# Patient Record
Sex: Male | Born: 1980 | Race: White | Hispanic: No | Marital: Single | Smoking: Current every day smoker
Health system: Southern US, Community
[De-identification: ages and names within clinical notes are randomized; demographics above are authoritative.]

## PROBLEM LIST (undated history)

## (undated) DIAGNOSIS — F329 Major depressive disorder, single episode, unspecified: Secondary | ICD-10-CM

## (undated) DIAGNOSIS — I313 Pericardial effusion (noninflammatory): Secondary | ICD-10-CM

## (undated) DIAGNOSIS — I10 Essential (primary) hypertension: Secondary | ICD-10-CM

## (undated) HISTORY — PX: WISDOM TOOTH EXTRACTION: SHX21

---
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
## 2018-10-11 ENCOUNTER — Inpatient Hospital Stay (HOSPITAL_COMMUNITY)
Admission: EM | Admit: 2018-10-11 | Discharge: 2018-10-15 | DRG: 605 | Disposition: A | Discharge status: Other Acute Inpatient Hospital | Payer: Self-pay | Source: Other Acute Inpatient Hospital | Attending: General Surgery | Admitting: General Surgery

## 2018-10-11 ENCOUNTER — Encounter: Payer: Self-pay | Admitting: Emergency Medicine

## 2018-10-11 ENCOUNTER — Emergency Department
Admission: EM | Admit: 2018-10-11 | Discharge: 2018-10-11 | Disposition: A | Discharge status: Other Acute Inpatient Hospital | Payer: Self-pay | Attending: Emergency Medicine | Admitting: Emergency Medicine

## 2018-10-11 ENCOUNTER — Emergency Department: Payer: Self-pay

## 2018-10-11 ENCOUNTER — Encounter (HOSPITAL_COMMUNITY): Payer: Self-pay | Admitting: Emergency Medicine

## 2018-10-11 ENCOUNTER — Other Ambulatory Visit: Payer: Self-pay

## 2018-10-11 DIAGNOSIS — W01118A Fall on same level from slipping, tripping and stumbling with subsequent striking against other sharp object, initial encounter: Secondary | ICD-10-CM | POA: Insufficient documentation

## 2018-10-11 DIAGNOSIS — S31119A Laceration without foreign body of abdominal wall, unspecified quadrant without penetration into peritoneal cavity, initial encounter: Secondary | ICD-10-CM

## 2018-10-11 DIAGNOSIS — Y92007 Garden or yard of unspecified non-institutional (private) residence as the place of occurrence of the external cause: Secondary | ICD-10-CM | POA: Insufficient documentation

## 2018-10-11 DIAGNOSIS — Z8249 Family history of ischemic heart disease and other diseases of the circulatory system: Secondary | ICD-10-CM

## 2018-10-11 DIAGNOSIS — Z7289 Other problems related to lifestyle: Secondary | ICD-10-CM

## 2018-10-11 DIAGNOSIS — Z885 Allergy status to narcotic agent status: Secondary | ICD-10-CM

## 2018-10-11 DIAGNOSIS — S36114A Minor laceration of liver, initial encounter: Secondary | ICD-10-CM | POA: Diagnosis present

## 2018-10-11 DIAGNOSIS — IMO0002 Reserved for concepts with insufficient information to code with codable children: Secondary | ICD-10-CM

## 2018-10-11 DIAGNOSIS — I10 Essential (primary) hypertension: Secondary | ICD-10-CM | POA: Insufficient documentation

## 2018-10-11 DIAGNOSIS — T148XXA Other injury of unspecified body region, initial encounter: Secondary | ICD-10-CM

## 2018-10-11 DIAGNOSIS — D62 Acute posthemorrhagic anemia: Secondary | ICD-10-CM | POA: Diagnosis present

## 2018-10-11 DIAGNOSIS — F17200 Nicotine dependence, unspecified, uncomplicated: Secondary | ICD-10-CM | POA: Insufficient documentation

## 2018-10-11 DIAGNOSIS — Z1159 Encounter for screening for other viral diseases: Secondary | ICD-10-CM

## 2018-10-11 DIAGNOSIS — Y93K1 Activity, walking an animal: Secondary | ICD-10-CM

## 2018-10-11 DIAGNOSIS — Y9389 Activity, other specified: Secondary | ICD-10-CM | POA: Insufficient documentation

## 2018-10-11 DIAGNOSIS — Y929 Unspecified place or not applicable: Secondary | ICD-10-CM

## 2018-10-11 DIAGNOSIS — R1010 Upper abdominal pain, unspecified: Secondary | ICD-10-CM | POA: Insufficient documentation

## 2018-10-11 DIAGNOSIS — R9431 Abnormal electrocardiogram [ECG] [EKG]: Secondary | ICD-10-CM

## 2018-10-11 DIAGNOSIS — I309 Acute pericarditis, unspecified: Secondary | ICD-10-CM | POA: Diagnosis present

## 2018-10-11 DIAGNOSIS — F17210 Nicotine dependence, cigarettes, uncomplicated: Secondary | ICD-10-CM | POA: Diagnosis present

## 2018-10-11 DIAGNOSIS — S31139A Puncture wound of abdominal wall without foreign body, unspecified quadrant without penetration into peritoneal cavity, initial encounter: Secondary | ICD-10-CM | POA: Insufficient documentation

## 2018-10-11 DIAGNOSIS — Y999 Unspecified external cause status: Secondary | ICD-10-CM | POA: Insufficient documentation

## 2018-10-11 DIAGNOSIS — S31112A Laceration without foreign body of abdominal wall, epigastric region without penetration into peritoneal cavity, initial encounter: Principal | ICD-10-CM | POA: Diagnosis present

## 2018-10-11 DIAGNOSIS — F1510 Other stimulant abuse, uncomplicated: Secondary | ICD-10-CM | POA: Diagnosis present

## 2018-10-11 DIAGNOSIS — Z8673 Personal history of transient ischemic attack (TIA), and cerebral infarction without residual deficits: Secondary | ICD-10-CM

## 2018-10-11 DIAGNOSIS — Z046 Encounter for general psychiatric examination, requested by authority: Secondary | ICD-10-CM

## 2018-10-11 DIAGNOSIS — R0789 Other chest pain: Secondary | ICD-10-CM

## 2018-10-11 DIAGNOSIS — W010XXA Fall on same level from slipping, tripping and stumbling without subsequent striking against object, initial encounter: Secondary | ICD-10-CM | POA: Diagnosis present

## 2018-10-11 HISTORY — DX: Essential (primary) hypertension: I10

## 2018-10-11 LAB — COMPREHENSIVE METABOLIC PANEL
ALT: 37 U/L (ref 0–44)
AST: 83 U/L — ABNORMAL HIGH (ref 15–41)
Albumin: 4.5 g/dL (ref 3.5–5.0)
Alkaline Phosphatase: 64 U/L (ref 38–126)
Anion gap: 13 (ref 5–15)
BUN: 25 mg/dL — ABNORMAL HIGH (ref 6–20)
CO2: 23 mmol/L (ref 22–32)
Calcium: 8.7 mg/dL — ABNORMAL LOW (ref 8.9–10.3)
Chloride: 100 mmol/L (ref 98–111)
Creatinine, Ser: 0.92 mg/dL (ref 0.61–1.24)
GFR calc Af Amer: >60 mL/min (ref >60)
GFR calc non Af Amer: >60 mL/min (ref >60)
Glucose, Bld: 124 mg/dL — ABNORMAL HIGH (ref 70–99)
Potassium: 3.1 mmol/L — ABNORMAL LOW (ref 3.5–5.1)
Sodium: 136 mmol/L (ref 135–145)
Total Bilirubin: 2 mg/dL — ABNORMAL HIGH (ref 0.3–1.2)
Total Protein: 7.8 g/dL (ref 6.5–8.1)

## 2018-10-11 LAB — URINE DRUG SCREEN, QUALITATIVE (ARMC ONLY)
Amphetamines, Ur Screen: POSITIVE — AB
Barbiturates, Ur Screen: NOT DETECTED
Benzodiazepine, Ur Scrn: NOT DETECTED
Cannabinoid 50 Ng, Ur ~~LOC~~: POSITIVE — AB
Cocaine Metabolite,Ur ~~LOC~~: NOT DETECTED
MDMA (Ecstasy)Ur Screen: NOT DETECTED
Methadone Scn, Ur: NOT DETECTED
Opiate, Ur Screen: NOT DETECTED
Phencyclidine (PCP) Ur S: NOT DETECTED
Tricyclic, Ur Screen: NOT DETECTED

## 2018-10-11 LAB — CBC
HCT: 36.3 % — ABNORMAL LOW (ref 39.0–52.0)
HCT: 34 % — ABNORMAL LOW (ref 39.0–52.0)
Hemoglobin: 12.4 g/dL — ABNORMAL LOW (ref 13.0–17.0)
Hemoglobin: 11.7 g/dL — ABNORMAL LOW (ref 13.0–17.0)
MCH: 30.5 pg (ref 26.0–34.0)
MCH: 31.2 pg (ref 26.0–34.0)
MCHC: 34.2 g/dL (ref 30.0–36.0)
MCHC: 34.4 g/dL (ref 30.0–36.0)
MCV: 89.2 fL (ref 80.0–100.0)
MCV: 90.7 fL (ref 80.0–100.0)
Platelets: 215 10*3/uL (ref 150–400)
Platelets: 189 10*3/uL (ref 150–400)
RBC: 4.07 MIL/uL — ABNORMAL LOW (ref 4.22–5.81)
RBC: 3.75 MIL/uL — ABNORMAL LOW (ref 4.22–5.81)
RDW: 12.5 % (ref 11.5–15.5)
RDW: 12.8 % (ref 11.5–15.5)
WBC: 17.8 10*3/uL — ABNORMAL HIGH (ref 4.0–10.5)
WBC: 17.1 10*3/uL — ABNORMAL HIGH (ref 4.0–10.5)
nRBC: 0 % (ref 0.0–0.2)
nRBC: 0 % (ref 0.0–0.2)

## 2018-10-11 LAB — CK: Total CK: 1787 U/L — ABNORMAL HIGH (ref 49–397)

## 2018-10-11 LAB — HIV ANTIBODY (ROUTINE TESTING W REFLEX): HIV Screen 4th Generation wRfx: NONREACTIVE

## 2018-10-11 LAB — TYPE AND SCREEN
ABO/RH(D): A NEG
Antibody Screen: NEGATIVE

## 2018-10-11 LAB — LIPASE, BLOOD: Lipase: 23 U/L (ref 11–51)

## 2018-10-11 LAB — ACETAMINOPHEN LEVEL: Acetaminophen (Tylenol), Serum: <10 ug/mL — ABNORMAL LOW (ref 10–30)

## 2018-10-11 LAB — SALICYLATE LEVEL: Salicylate Lvl: <7 mg/dL (ref 2.8–30.0)

## 2018-10-11 LAB — SARS CORONAVIRUS 2 BY RT PCR (HOSPITAL ORDER, PERFORMED IN ~~LOC~~ HOSPITAL LAB): SARS Coronavirus 2: NEGATIVE

## 2018-10-11 MED ORDER — CEFAZOLIN SODIUM-DEXTROSE 1-4 GM/50ML-% IV SOLN
1.0000 g | Freq: Once | INTRAVENOUS | Status: AC
  Administered 2018-10-11: 1 g via INTRAVENOUS
  Filled 2018-10-11: qty 50

## 2018-10-11 MED ORDER — HYDROMORPHONE HCL 1 MG/ML IJ SOLN: 0.5000 mg | INTRAMUSCULAR | Status: DC

## 2018-10-11 MED ORDER — LORAZEPAM 2 MG/ML IJ SOLN: 1.0000 mg | INTRAMUSCULAR | Status: DC | PRN

## 2018-10-11 MED ORDER — LORAZEPAM 2 MG/ML IJ SOLN
1.0000 mg | Freq: Once | INTRAMUSCULAR | Status: DC
  Filled 2018-10-11: qty 1

## 2018-10-11 MED ORDER — DEXTROSE-NACL 5-0.9 % IV SOLN
INTRAVENOUS | Status: DC
  Administered 2018-10-11: 06:00:00 via INTRAVENOUS

## 2018-10-11 MED ORDER — LORAZEPAM 2 MG/ML IJ SOLN
1.0000 mg | Freq: Once | INTRAMUSCULAR | Status: AC
  Administered 2018-10-11: 05:00:00 1 mg via INTRAVENOUS
  Filled 2018-10-11: qty 1

## 2018-10-11 MED ORDER — ENOXAPARIN SODIUM 40 MG/0.4ML ~~LOC~~ SOLN
40.0000 mg | SUBCUTANEOUS | Status: DC
  Administered 2018-10-11: 40 mg via SUBCUTANEOUS
  Filled 2018-10-11: qty 0.4

## 2018-10-11 MED ORDER — ONDANSETRON 4 MG PO TBDP: 4.0000 mg | ORAL_TABLET | Freq: Four times a day (QID) | ORAL | Status: DC | PRN

## 2018-10-11 MED ORDER — LORAZEPAM 2 MG/ML IJ SOLN: 1.0000 mg | Freq: Once | INTRAMUSCULAR | Status: DC

## 2018-10-11 MED ORDER — LORAZEPAM 2 MG/ML IJ SOLN
1.0000 mg | Freq: Once | INTRAMUSCULAR | Status: AC
  Administered 2018-10-11: 1 mg via INTRAVENOUS

## 2018-10-11 MED ORDER — OXYCODONE HCL 5 MG PO TABS
5.0000 mg | ORAL_TABLET | ORAL | Status: DC | PRN
  Administered 2018-10-11 – 2018-10-15 (×9): 10 mg via ORAL
  Filled 2018-10-11 (×10): qty 2

## 2018-10-11 MED ORDER — POTASSIUM CHLORIDE CRYS ER 20 MEQ PO TBCR
40.0000 meq | EXTENDED_RELEASE_TABLET | Freq: Two times a day (BID) | ORAL | Status: AC
  Administered 2018-10-11 (×2): 40 meq via ORAL
  Filled 2018-10-11 (×2): qty 2

## 2018-10-11 MED ORDER — ONDANSETRON HCL 4 MG/2ML IJ SOLN: 4.0000 mg | Freq: Four times a day (QID) | INTRAMUSCULAR | Status: DC | PRN

## 2018-10-11 MED ORDER — IOHEXOL 300 MG/ML  SOLN
100.0000 mL | Freq: Once | INTRAMUSCULAR | Status: AC | PRN
  Administered 2018-10-11: 100 mL via INTRAVENOUS

## 2018-10-11 MED ORDER — HYDROMORPHONE HCL 1 MG/ML IJ SOLN
1.0000 mg | INTRAMUSCULAR | Status: AC
  Administered 2018-10-11: 04:00:00 1 mg via INTRAVENOUS
  Filled 2018-10-11: qty 1

## 2018-10-11 MED ORDER — HYDROMORPHONE HCL 1 MG/ML IJ SOLN: 1.0000 mg | INTRAMUSCULAR | Status: DC | PRN
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
## 2018-10-11 NOTE — ED Notes (Signed)
IVC prior to arrival/ Medical @ this time

## 2018-10-11 NOTE — ED Notes (Signed)
Staffing notified RN that sitter is arriving at 7 am .

## 2018-10-11 NOTE — ED Provider Notes (Signed)
Became very anxious when transport arrived, he was up walking about doing meditative-like behavior.  He got more on edge, but with discussion he is able to be calm.  He has not done anything violent towards staff.  He was excepting of receiving 2 mg of Ativan to help with anxiolysis and accommodating and getting himself to the stretcher, seating himself.  He will be escorted by Event organiser to [place].   Delman Kitten, MD 10/11/18 [phone]

## 2018-10-11 NOTE — ED Notes (Signed)
Pt states he doesn't feel comfortable signing for transfer to [facility]
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
## 2018-10-11 NOTE — Consult Note (Signed)
Patient is too drowsy to hold a meaningful interview per nursing. Will attempt to interview again tomorrow.   Buford Dresser, DO 10/11/18 2:16 PM
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
## 2018-10-11 NOTE — ED Notes (Signed)
Standing at foot of bed, doesn't want to lay or sit down
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
## 2018-10-11 NOTE — Progress Notes (Signed)
Patient ID: Amadeus Oyama., male   DOB: Oct 21, 1980, 38 y.o.   MRN: [id number]    Subjective: Does not offer complaint  Objective: Vital signs in last 24 hours: Temp:  [98 F (36.7 C)-98.7 F (37.1 C)] 98.7 F (37.1 C) (05/19 0842) Pulse Rate:  [89-112] 90 (05/19 0842) Resp:  [12-25] 20 (05/19 0842) BP: (112-149)/(68-89) 112/78 (05/19 0842) SpO2:  [95 %-100 %] 100 % (05/19 0842) Weight:  [72.6 kg] 72.6 kg (05/19 0516)    Intake/Output from previous day: No intake/output data recorded. Intake/Output this shift: Total I/O In: 161 [I.V.:161] Out: -   General appearance: no distress Resp: clear to auscultation bilaterally Cardio: regular rate and rhythm GI: soft, tender at SW, no generalized tenderness, no epritonitis  Lab Results: CBC  Recent Labs    10/11/18 0239  WBC 17.8*  HGB 12.4*  HCT 36.3*  PLT 215   BMET Recent Labs    10/11/18 0239  NA 136  K 3.1*  CL 100  CO2 23  GLUCOSE 124*  BUN 25*  CREATININE 0.92  CALCIUM 8.7*   Assessment/Plan: SI SW abdomen Grade 1 liver laceration - mobilize, follow up CBC 1200 and in AM ABL anemia - mild Psychiatry consult FEN - soft diet, add PO pain meds, KVO IVF VTE  - Lovenox Dispo - above   LOS: 0 days    Georganna Skeans, MD, MPH, FACS Trauma & General Surgery: [phone]  10/11/2018
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
## 2018-10-11 NOTE — ED Notes (Addendum)
Patient wearing purple paper scrubs , sleeping at this time , respirations unlabored, IV site intact , sitter order requested . Personal belongings inventoried ,bagged and stored at locker#1 at [facility].
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
## 2018-10-12 ENCOUNTER — Inpatient Hospital Stay (HOSPITAL_COMMUNITY): Payer: Self-pay

## 2018-10-12 ENCOUNTER — Encounter (HOSPITAL_COMMUNITY): Payer: Self-pay | Admitting: Student

## 2018-10-12 ENCOUNTER — Encounter (HOSPITAL_COMMUNITY)
Admission: EM | Disposition: A | Discharge status: Other Acute Inpatient Hospital | Payer: Self-pay | Source: Other Acute Inpatient Hospital

## 2018-10-12 DIAGNOSIS — Z7289 Other problems related to lifestyle: Secondary | ICD-10-CM

## 2018-10-12 DIAGNOSIS — R0789 Other chest pain: Secondary | ICD-10-CM

## 2018-10-12 DIAGNOSIS — R079 Chest pain, unspecified: Secondary | ICD-10-CM

## 2018-10-12 DIAGNOSIS — R9431 Abnormal electrocardiogram [ECG] [EKG]: Secondary | ICD-10-CM

## 2018-10-12 DIAGNOSIS — IMO0002 Reserved for concepts with insufficient information to code with codable children: Secondary | ICD-10-CM

## 2018-10-12 HISTORY — PX: LEFT HEART CATH AND CORONARY ANGIOGRAPHY: CATH118249

## 2018-10-12 LAB — LIPID PANEL
Cholesterol: 145 mg/dL (ref 0–200)
HDL: 47 mg/dL (ref >40)
LDL Cholesterol: 89 mg/dL (ref 0–99)
Total CHOL/HDL Ratio: 3.1 RATIO
Triglycerides: 47 mg/dL (ref <150)
VLDL: 9 mg/dL (ref 0–40)

## 2018-10-12 LAB — CBC
HCT: 34.9 % — ABNORMAL LOW (ref 39.0–52.0)
Hemoglobin: 11.6 g/dL — ABNORMAL LOW (ref 13.0–17.0)
MCH: 30.5 pg (ref 26.0–34.0)
MCHC: 33.2 g/dL (ref 30.0–36.0)
MCV: 91.8 fL (ref 80.0–100.0)
Platelets: 176 10*3/uL (ref 150–400)
RBC: 3.8 MIL/uL — ABNORMAL LOW (ref 4.22–5.81)
RDW: 13 % (ref 11.5–15.5)
WBC: 13.7 10*3/uL — ABNORMAL HIGH (ref 4.0–10.5)
nRBC: 0 % (ref 0.0–0.2)

## 2018-10-12 LAB — BASIC METABOLIC PANEL
Anion gap: 13 (ref 5–15)
BUN: 14 mg/dL (ref 6–20)
CO2: 20 mmol/L — ABNORMAL LOW (ref 22–32)
Calcium: 8.7 mg/dL — ABNORMAL LOW (ref 8.9–10.3)
Chloride: 100 mmol/L (ref 98–111)
Creatinine, Ser: 0.84 mg/dL (ref 0.61–1.24)
GFR calc Af Amer: >60 mL/min (ref >60)
GFR calc non Af Amer: >60 mL/min (ref >60)
Glucose, Bld: 128 mg/dL — ABNORMAL HIGH (ref 70–99)
Potassium: 4 mmol/L (ref 3.5–5.1)
Sodium: 133 mmol/L — ABNORMAL LOW (ref 135–145)

## 2018-10-12 LAB — ECHOCARDIOGRAM COMPLETE
Height: 68 in
Weight: 2560 oz

## 2018-10-12 LAB — TROPONIN I
Troponin I: 0.23 ng/mL (ref <0.03)
Troponin I: 0.21 ng/mL (ref <0.03)
Troponin I: 0.17 ng/mL (ref <0.03)

## 2018-10-12 LAB — HEMOGLOBIN A1C
Hgb A1c MFr Bld: 5.2 % (ref 4.8–5.6)
Mean Plasma Glucose: 102.54 mg/dL

## 2018-10-12 SURGERY — LEFT HEART CATH AND CORONARY ANGIOGRAPHY
Anesthesia: LOCAL

## 2018-10-12 MED ORDER — HYDRALAZINE HCL 20 MG/ML IJ SOLN: 10.0000 mg | INTRAMUSCULAR | Status: AC | PRN

## 2018-10-12 MED ORDER — SODIUM CHLORIDE 0.9 % IV SOLN: INTRAVENOUS | Status: AC

## 2018-10-12 MED ORDER — FENTANYL CITRATE (PF) 100 MCG/2ML IJ SOLN
INTRAMUSCULAR | Status: AC
  Filled 2018-10-12: qty 2

## 2018-10-12 MED ORDER — ASPIRIN 81 MG PO CHEW
81.0000 mg | CHEWABLE_TABLET | ORAL | Status: AC
  Administered 2018-10-12: 11:00:00 81 mg via ORAL
  Filled 2018-10-12: qty 1

## 2018-10-12 MED ORDER — SODIUM CHLORIDE 0.9 % IV SOLN: 250.0000 mL | INTRAVENOUS | Status: DC | PRN

## 2018-10-12 MED ORDER — DOCUSATE SODIUM 100 MG PO CAPS
100.0000 mg | ORAL_CAPSULE | Freq: Every day | ORAL | Status: DC
  Administered 2018-10-12 – 2018-10-15 (×4): 100 mg via ORAL
  Filled 2018-10-12 (×4): qty 1

## 2018-10-12 MED ORDER — FENTANYL CITRATE (PF) 100 MCG/2ML IJ SOLN
INTRAMUSCULAR | Status: DC | PRN
  Administered 2018-10-12: 25 ug via INTRAVENOUS

## 2018-10-12 MED ORDER — SODIUM CHLORIDE 0.9% FLUSH: 3.0000 mL | INTRAVENOUS | Status: DC | PRN

## 2018-10-12 MED ORDER — SODIUM CHLORIDE 0.9 % WEIGHT BASED INFUSION: 1.0000 mL/kg/h | INTRAVENOUS | Status: DC

## 2018-10-12 MED ORDER — ONDANSETRON HCL 4 MG/2ML IJ SOLN: 4.0000 mg | Freq: Four times a day (QID) | INTRAMUSCULAR | Status: DC | PRN

## 2018-10-12 MED ORDER — SODIUM CHLORIDE 0.9% FLUSH: 3.0000 mL | Freq: Two times a day (BID) | INTRAVENOUS | Status: DC

## 2018-10-12 MED ORDER — LABETALOL HCL 5 MG/ML IV SOLN: 10.0000 mg | INTRAVENOUS | Status: AC | PRN

## 2018-10-12 MED ORDER — IOHEXOL 350 MG/ML SOLN
INTRAVENOUS | Status: DC | PRN
  Administered 2018-10-12: 12:00:00 55 mL via INTRACARDIAC

## 2018-10-12 MED ORDER — HEPARIN (PORCINE) IN NACL 1000-0.9 UT/500ML-% IV SOLN
INTRAVENOUS | Status: DC | PRN
  Administered 2018-10-12 (×2): 500 mL

## 2018-10-12 MED ORDER — MIDAZOLAM HCL 2 MG/2ML IJ SOLN
INTRAMUSCULAR | Status: DC | PRN
  Administered 2018-10-12: 1 mg via INTRAVENOUS

## 2018-10-12 MED ORDER — MIDAZOLAM HCL 2 MG/2ML IJ SOLN
INTRAMUSCULAR | Status: AC
  Filled 2018-10-12: qty 2

## 2018-10-12 MED ORDER — DIAZEPAM 5 MG PO TABS: 5.0000 mg | ORAL_TABLET | Freq: Four times a day (QID) | ORAL | Status: DC | PRN

## 2018-10-12 MED ORDER — POLYETHYLENE GLYCOL 3350 17 G PO PACK
17.0000 g | PACK | Freq: Every day | ORAL | Status: DC
  Administered 2018-10-12 – 2018-10-14 (×2): 17 g via ORAL
  Filled 2018-10-12 (×4): qty 1

## 2018-10-12 MED ORDER — HEPARIN (PORCINE) IN NACL 1000-0.9 UT/500ML-% IV SOLN
INTRAVENOUS | Status: AC
  Filled 2018-10-12: qty 1000

## 2018-10-12 MED ORDER — LIDOCAINE HCL (PF) 1 % IJ SOLN
INTRAMUSCULAR | Status: AC
  Filled 2018-10-12: qty 30

## 2018-10-12 MED ORDER — ACETAMINOPHEN 325 MG PO TABS: 650.0000 mg | ORAL_TABLET | ORAL | Status: DC | PRN

## 2018-10-12 MED ORDER — SODIUM CHLORIDE 0.9% FLUSH
3.0000 mL | Freq: Two times a day (BID) | INTRAVENOUS | Status: DC
  Administered 2018-10-12 – 2018-10-15 (×6): 3 mL via INTRAVENOUS

## 2018-10-12 MED ORDER — LIDOCAINE HCL (PF) 1 % IJ SOLN
INTRAMUSCULAR | Status: DC | PRN
  Administered 2018-10-12: 20 mL

## 2018-10-12 MED ORDER — SODIUM CHLORIDE 0.9 % WEIGHT BASED INFUSION: 3.0000 mL/kg/h | INTRAVENOUS | Status: DC

## 2018-10-12 SURGICAL SUPPLY — 12 items
CATH INFINITI 5FR MULTPACK ANG (CATHETERS) ×2 IMPLANT
CLOSURE MYNX CONTROL 5F (Vascular Products) ×2 IMPLANT
ELECT DEFIB PAD ADLT CADENCE (PAD) ×2 IMPLANT
KIT ENCORE 26 ADVANTAGE (KITS) ×2 IMPLANT
KIT HEART LEFT (KITS) ×2 IMPLANT
PACK CARDIAC CATHETERIZATION (CUSTOM PROCEDURE TRAY) ×2 IMPLANT
SHEATH PINNACLE 5F 10CM (SHEATH) ×2 IMPLANT
SHEATH PROBE COVER 6X72 (BAG) ×2 IMPLANT
SYR MEDRAD MARK 7 150ML (SYRINGE) ×2 IMPLANT
TRANSDUCER W/STOPCOCK (MISCELLANEOUS) ×2 IMPLANT
TUBING CIL FLEX 10 FLL-RA (TUBING) ×2 IMPLANT
WIRE EMERALD 3MM-J .035X150CM (WIRE) ×2 IMPLANT
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
## 2018-10-12 NOTE — Progress Notes (Signed)
..  EKG CRITICAL VALUE     12 lead EKG performed.  Critical value noted.  Marlene Bast, RN notified.   Ihor Gully, CCT 10/12/2018 10:04 AM
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
## 2018-10-12 NOTE — Progress Notes (Signed)
Talked to [facility] with cardiology for stat cardiology consult based on EKG findings. Troponin pending.  Jackson Latino, [facility] Surgery Pager [phone]

## 2018-10-12 NOTE — Progress Notes (Signed)
Emergent cardiac cath discussed with patient with new inferolateral ST elevation. ? Acute pericarditis vs ACS. The risks and benefits of a cardiac catheterization including, but not limited to, death, stroke, MI, kidney damage and bleeding were discussed with the patient who indicates understanding and agrees to proceed.  Shelva Majestic, MD

## 2018-10-12 NOTE — Progress Notes (Signed)
  Echocardiogram 2D Echocardiogram has been performed.  Kevin Schaefer Aure 10/12/2018, 3:29 PM
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
## 2018-10-13 ENCOUNTER — Inpatient Hospital Stay (HOSPITAL_COMMUNITY): Payer: Self-pay

## 2018-10-13 DIAGNOSIS — I312 Hemopericardium, not elsewhere classified: Secondary | ICD-10-CM

## 2018-10-13 DIAGNOSIS — I313 Pericardial effusion (noninflammatory): Secondary | ICD-10-CM

## 2018-10-13 LAB — CBC
HCT: 33.7 % — ABNORMAL LOW (ref 39.0–52.0)
Hemoglobin: 11.1 g/dL — ABNORMAL LOW (ref 13.0–17.0)
MCH: 30.3 pg (ref 26.0–34.0)
MCHC: 32.9 g/dL (ref 30.0–36.0)
MCV: 92.1 fL (ref 80.0–100.0)
Platelets: 154 10*3/uL (ref 150–400)
RBC: 3.66 MIL/uL — ABNORMAL LOW (ref 4.22–5.81)
RDW: 12.6 % (ref 11.5–15.5)
WBC: 14 10*3/uL — ABNORMAL HIGH (ref 4.0–10.5)
nRBC: 0 % (ref 0.0–0.2)

## 2018-10-13 LAB — BASIC METABOLIC PANEL
Anion gap: 9 (ref 5–15)
BUN: 9 mg/dL (ref 6–20)
CO2: 25 mmol/L (ref 22–32)
Calcium: 8.7 mg/dL — ABNORMAL LOW (ref 8.9–10.3)
Chloride: 98 mmol/L (ref 98–111)
Creatinine, Ser: 0.84 mg/dL (ref 0.61–1.24)
GFR calc Af Amer: >60 mL/min (ref >60)
GFR calc non Af Amer: >60 mL/min (ref >60)
Glucose, Bld: 132 mg/dL — ABNORMAL HIGH (ref 70–99)
Potassium: 4.2 mmol/L (ref 3.5–5.1)
Sodium: 132 mmol/L — ABNORMAL LOW (ref 135–145)

## 2018-10-13 LAB — ECHOCARDIOGRAM COMPLETE
Height: 68 in
Weight: 2560 oz

## 2018-10-13 LAB — TSH: TSH: 2.461 u[IU]/mL (ref 0.350–4.500)

## 2018-10-13 LAB — C-REACTIVE PROTEIN: CRP: 22.4 mg/dL — ABNORMAL HIGH (ref <1.0)

## 2018-10-13 MED ORDER — IBUPROFEN 200 MG PO TABS
400.0000 mg | ORAL_TABLET | Freq: Four times a day (QID) | ORAL | Status: DC
  Administered 2018-10-13 – 2018-10-14 (×4): 400 mg via ORAL
  Filled 2018-10-13 (×4): qty 2

## 2018-10-13 MED ORDER — ENOXAPARIN SODIUM 40 MG/0.4ML ~~LOC~~ SOLN
40.0000 mg | Freq: Every day | SUBCUTANEOUS | Status: DC
  Administered 2018-10-13 – 2018-10-15 (×3): 40 mg via SUBCUTANEOUS
  Filled 2018-10-13 (×3): qty 0.4

## 2018-10-13 MED ORDER — COLCHICINE 0.6 MG PO TABS
0.6000 mg | ORAL_TABLET | Freq: Two times a day (BID) | ORAL | Status: DC
  Administered 2018-10-13 – 2018-10-15 (×6): 0.6 mg via ORAL
  Filled 2018-10-13 (×6): qty 1

## 2018-10-13 NOTE — Progress Notes (Signed)
  Echocardiogram 2D Echocardiogram has been performed.  Kevin Schaefer 10/13/2018, 11:47 AM
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
## 2018-10-13 NOTE — TOC Progression Note (Signed)
Transition of Care (TOC) - Progression Note    Patient Details  Name: Kevin Schaefer. MRN: [id number] Date of Birth: 1981-01-11  Transition of Care [facility]) CM/SW Contact  Alizae Bechtel, Francetta Found, LCSW Phone Number: 10/13/2018, 4:04 PM  Clinical Narrative:     Clinical Social Worker continuing to follow patient and family for support and discharge planning needs.  CSW has contacted the [facility] to notify of potential discharge tomorrow - if patient is medically ready tomorrow [facility] to be notified again.  CSW remains available for support and to facilitate patient discharge needs.  Expected Discharge Plan: Psychiatric Hospital Barriers to Discharge: Continued Medical Work up  Expected Discharge Plan and Services Expected Discharge Plan: [facility] In-house Referral: Clinical Social Work Discharge Planning Services: Other - See comment(inpatient psych Services) Post Acute Care Choice: NA Living arrangements for the past 2 months: Single Family Home                 DME Arranged: N/A DME Agency: NA                   Social Determinants of Health (SDOH) Interventions    Readmission Risk Interventions No flowsheet data found.

## 2018-10-14 DIAGNOSIS — I319 Disease of pericardium, unspecified: Secondary | ICD-10-CM

## 2018-10-14 DIAGNOSIS — Z7289 Other problems related to lifestyle: Secondary | ICD-10-CM

## 2018-10-14 DIAGNOSIS — C801 Malignant (primary) neoplasm, unspecified: Secondary | ICD-10-CM

## 2018-10-14 DIAGNOSIS — I313 Pericardial effusion (noninflammatory): Secondary | ICD-10-CM

## 2018-10-14 LAB — SEDIMENTATION RATE: Sed Rate: 57 mm/hr — ABNORMAL HIGH (ref 0–16)

## 2018-10-14 LAB — CBC
HCT: 30.8 % — ABNORMAL LOW (ref 39.0–52.0)
Hemoglobin: 10.4 g/dL — ABNORMAL LOW (ref 13.0–17.0)
MCH: 30.4 pg (ref 26.0–34.0)
MCHC: 33.8 g/dL (ref 30.0–36.0)
MCV: 90.1 fL (ref 80.0–100.0)
Platelets: 156 10*3/uL (ref 150–400)
RBC: 3.42 MIL/uL — ABNORMAL LOW (ref 4.22–5.81)
RDW: 12.4 % (ref 11.5–15.5)
WBC: 10.3 10*3/uL (ref 4.0–10.5)
nRBC: 0 % (ref 0.0–0.2)

## 2018-10-14 LAB — ANTINUCLEAR ANTIBODIES, IFA: ANA Ab, IFA: NEGATIVE

## 2018-10-14 MED ORDER — IBUPROFEN 200 MG PO TABS
600.0000 mg | ORAL_TABLET | Freq: Four times a day (QID) | ORAL | Status: DC
  Administered 2018-10-14 – 2018-10-15 (×5): 600 mg via ORAL
  Filled 2018-10-14 (×5): qty 3
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
## 2018-10-15 ENCOUNTER — Inpatient Hospital Stay (HOSPITAL_COMMUNITY): Payer: Self-pay

## 2018-10-15 ENCOUNTER — Inpatient Hospital Stay (HOSPITAL_COMMUNITY)
Admission: AD | Admit: 2018-10-15 | Discharge: 2018-10-18 | DRG: 885 | Disposition: A | Discharge status: Hospice | Payer: Self-pay | Source: Intra-hospital | Attending: Emergency Medicine | Admitting: Emergency Medicine

## 2018-10-15 ENCOUNTER — Encounter (HOSPITAL_COMMUNITY): Payer: Self-pay | Admitting: *Deleted

## 2018-10-15 ENCOUNTER — Other Ambulatory Visit: Payer: Self-pay

## 2018-10-15 DIAGNOSIS — I10 Essential (primary) hypertension: Secondary | ICD-10-CM | POA: Diagnosis present

## 2018-10-15 DIAGNOSIS — F4322 Adjustment disorder with anxiety: Secondary | ICD-10-CM | POA: Diagnosis present

## 2018-10-15 DIAGNOSIS — I361 Nonrheumatic tricuspid (valve) insufficiency: Secondary | ICD-10-CM

## 2018-10-15 DIAGNOSIS — Z885 Allergy status to narcotic agent status: Secondary | ICD-10-CM

## 2018-10-15 DIAGNOSIS — F17210 Nicotine dependence, cigarettes, uncomplicated: Secondary | ICD-10-CM | POA: Diagnosis present

## 2018-10-15 DIAGNOSIS — I313 Pericardial effusion (noninflammatory): Secondary | ICD-10-CM | POA: Diagnosis present

## 2018-10-15 DIAGNOSIS — R079 Chest pain, unspecified: Secondary | ICD-10-CM

## 2018-10-15 DIAGNOSIS — G4700 Insomnia, unspecified: Secondary | ICD-10-CM | POA: Diagnosis present

## 2018-10-15 DIAGNOSIS — Z8249 Family history of ischemic heart disease and other diseases of the circulatory system: Secondary | ICD-10-CM

## 2018-10-15 DIAGNOSIS — F332 Major depressive disorder, recurrent severe without psychotic features: Principal | ICD-10-CM | POA: Diagnosis present

## 2018-10-15 DIAGNOSIS — I3139 Other pericardial effusion (noninflammatory): Secondary | ICD-10-CM | POA: Diagnosis present

## 2018-10-15 DIAGNOSIS — F1510 Other stimulant abuse, uncomplicated: Secondary | ICD-10-CM | POA: Diagnosis present

## 2018-10-15 DIAGNOSIS — F1520 Other stimulant dependence, uncomplicated: Secondary | ICD-10-CM | POA: Diagnosis present

## 2018-10-15 LAB — ECHOCARDIOGRAM LIMITED
Height: 68 in
Weight: 2560 oz

## 2018-10-15 MED ORDER — TRAZODONE HCL 50 MG PO TABS
50.0000 mg | ORAL_TABLET | Freq: Every evening | ORAL | Status: DC | PRN
  Administered 2018-10-15 – 2018-10-16 (×2): 50 mg via ORAL
  Filled 2018-10-15 (×6): qty 1

## 2018-10-15 MED ORDER — OXYCODONE HCL 5 MG PO TABS: 5.0000 mg | ORAL_TABLET | ORAL | 0 refills | Status: DC | PRN

## 2018-10-15 MED ORDER — MAGNESIUM HYDROXIDE 400 MG/5ML PO SUSP: 30.0000 mL | Freq: Every day | ORAL | Status: DC | PRN

## 2018-10-15 MED ORDER — ALUM & MAG HYDROXIDE-SIMETH 200-200-20 MG/5ML PO SUSP: 30.0000 mL | ORAL | Status: DC | PRN

## 2018-10-15 MED ORDER — DOCUSATE SODIUM 100 MG PO CAPS
100.0000 mg | ORAL_CAPSULE | Freq: Every day | ORAL | Status: DC
  Administered 2018-10-16 – 2018-10-17 (×2): 100 mg via ORAL
  Filled 2018-10-15 (×4): qty 1

## 2018-10-15 MED ORDER — IBUPROFEN 600 MG PO TABS: 600.0000 mg | ORAL_TABLET | Freq: Four times a day (QID) | ORAL | 0 refills | Status: DC

## 2018-10-15 MED ORDER — POLYETHYLENE GLYCOL 3350 17 G PO PACK: 17.0000 g | PACK | Freq: Every day | ORAL | 0 refills | Status: DC

## 2018-10-15 MED ORDER — ACETAMINOPHEN 325 MG PO TABS: 650.0000 mg | ORAL_TABLET | Freq: Four times a day (QID) | ORAL | Status: DC | PRN

## 2018-10-15 MED ORDER — COLCHICINE 0.6 MG PO TABS: 0.6000 mg | ORAL_TABLET | Freq: Two times a day (BID) | ORAL | 0 refills | Status: DC

## 2018-10-15 MED ORDER — COLCHICINE 0.6 MG PO TABS
0.6000 mg | ORAL_TABLET | Freq: Two times a day (BID) | ORAL | Status: DC
  Administered 2018-10-16 – 2018-10-17 (×4): 0.6 mg via ORAL
  Filled 2018-10-15 (×7): qty 1

## 2018-10-15 NOTE — Progress Notes (Signed)
  Echocardiogram 2D Echocardiogram has been performed.  Kevin Schaefer 10/15/2018, 8:58 AM

## 2018-10-15 NOTE — TOC Transition Note (Addendum)
Transition of Care (TOC) - CM/SW Discharge Note   Patient Details  Name: Kevin Schaefer. MRN: [id number] Date of Birth: 02-15-1981  Transition [facility] Care [facility]) CM/SW Contact:  Gabrielle Dare Phone Number: 10/15/2018, 5:25 PM   Clinical Narrative:    Patient has been accepted at [facility].  Room 302 Bed 1.  The attending Dr. Mallie Darting.    Report must be called prior to patient being transferred. RN call to report [phone].     RN Crystal will call Betsy Pries [phone] at 7:50pm for transportation to [facility].    Final next level of care: Psychiatric Hospital Barriers to Discharge: No Barriers Identified   Patient Goals and CMS Choice Patient states their goals for this hospitalization and ongoing recovery are:: Patient would like to know about his dog and go home with his mother   Choice offered to / list presented to : NA  Discharge Placement              Patient chooses bed at: Other - please specify in the comment section below:([facility]) Patient to be transferred to facility by: [facility] Name of family member notified: Ardelle Lesches [phone] Patient and family notified of of transfer: 10/15/18  Discharge Plan and Services In-house Referral: Clinical Social Work Discharge Planning Services: Other - See comment(inpatient psych Services) Post Acute Care Choice: NA          DME Arranged: N/A DME Agency: NA                  Social Determinants of Health ([place]) Interventions     Readmission Risk Interventions No flowsheet data found.
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
## 2018-10-15 NOTE — Tx Team (Signed)
Initial Treatment Plan 10/15/2018 11:49 PM Jandel Philis Nettle. [id number]    PATIENT STRESSORS: Financial difficulties Occupational concerns Substance abuse Other: "my dog is missing since about 4 days ago"    PATIENT STRENGTHS: Average or above average intelligence Communication skills General fund of knowledge Religious Affiliation Supportive family/friends   PATIENT IDENTIFIED PROBLEMS: "get an outside support system other than at home"  "being proactive"     SI  Substance abuse  Psychosis per report           DISCHARGE CRITERIA:  Improved stabilization in mood, thinking, and/or behavior  PRELIMINARY DISCHARGE PLAN: Outpatient therapy  PATIENT/FAMILY INVOLVEMENT: This treatment plan has been presented to and reviewed with the patient, Kevin Schaefer..  The patient and family have been given the opportunity to ask questions and make suggestions.  Karie Kirks, [place] 10/15/2018, 11:49 PM
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
## 2018-10-16 MED ORDER — BACITRACIN-NEOMYCIN-POLYMYXIN OINTMENT TUBE
TOPICAL_OINTMENT | Freq: Two times a day (BID) | CUTANEOUS | Status: DC
  Administered 2018-10-16 – 2018-10-17 (×4): via TOPICAL
  Filled 2018-10-16 (×2): qty 14.17

## 2018-10-16 MED ORDER — IBUPROFEN 400 MG PO TABS
600.0000 mg | ORAL_TABLET | Freq: Four times a day (QID) | ORAL | Status: DC
  Administered 2018-10-16 – 2018-10-17 (×8): 600 mg via ORAL
  Filled 2018-10-16 (×17): qty 1

## 2018-10-16 NOTE — [facility] Group Notes (Signed)
[place] Group Notes: (Clinical Social Work)   10/16/2018      Type of Therapy:  Group Therapy   Participation Level:  Did Not Attend - was invited both individually by [facility] and by overhead announcement   Selmer Dominion, [place] 10/16/2018, 12:36 PM
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
## 2018-10-16 NOTE — Progress Notes (Signed)
[place] NOVEL CORONAVIRUS (COVID-19) DAILY CHECK-OFF SYMPTOMS - answer yes or no to each - every day NO YES  Have you had a fever in the past 24 hours?  . Fever (Temp > 37.80C / 100F) X   Have you had any of these symptoms in the past 24 hours? . New Cough .  Sore Throat  .  Shortness of Breath .  Difficulty Breathing .  Unexplained Body Aches   X   Have you had any one of these symptoms in the past 24 hours not related to allergies?   . Runny Nose .  Nasal Congestion .  Sneezing   X   If you have had runny nose, nasal congestion, sneezing in the past 24 hours, has it worsened?  X   EXPOSURES - check yes or no X   Have you traveled outside the state in the past 14 days?  X   Have you been in contact with someone with a confirmed diagnosis of COVID-19 or PUI in the past 14 days without wearing appropriate PPE?  X   Have you been living in the same home as a person with confirmed diagnosis of COVID-19 or a PUI (household contact)?    X   Have you been diagnosed with COVID-19?    X              What to do next: Answered NO to all: Answered YES to anything:   Proceed with unit schedule Follow the [facility] Inpatient Flowsheet.   
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
## 2018-10-16 NOTE — Progress Notes (Signed)
D: Pt was in bed in his room upon initial approach.  Pt presents with depressed affect and mood.  He reports he is "just tired.  I slept a lot today."  His goal is "trying to get to the point when I can stay up for a little bit."  Pt denies SI/HI, denies hallucinations, reports mid epigastric pain of 5/10.  Pt has been isolative to his room for the majority of the night.  He did come to the medication window for HS medications.  He was excused from evening group.  A: Met with pt 1:1.  Actively listened to pt and offered support and encouragement. Medications administered per order.  Q15 minute safety checks in place.  R: Pt is safe on the unit.  Pt is compliant with medications.  Pt verbally contracts for safety.  Will continue to monitor and assess.      10/16/18 2349  COVID-19 Daily Checkoff  Have you had a fever (temp > 37.80C/100F)  in the past 24 hours?  No  COVID-19 EXPOSURE  Have you traveled outside the state in the past 14 days? No  Have you been in contact with someone with a confirmed diagnosis of COVID-19 or PUI in the past 14 days without wearing appropriate PPE? No  Have you been living in the same home as a person with confirmed diagnosis of COVID-19 or a PUI (household contact)? No  Have you been diagnosed with COVID-19? No
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
## 2018-10-17 ENCOUNTER — Other Ambulatory Visit: Payer: Self-pay

## 2018-10-17 ENCOUNTER — Encounter (HOSPITAL_COMMUNITY): Payer: Self-pay | Admitting: *Deleted

## 2018-10-17 DIAGNOSIS — F1510 Other stimulant abuse, uncomplicated: Secondary | ICD-10-CM

## 2018-10-17 DIAGNOSIS — F1520 Other stimulant dependence, uncomplicated: Secondary | ICD-10-CM | POA: Diagnosis present

## 2018-10-17 MED ORDER — TRAZODONE HCL 50 MG PO TABS
100.0000 mg | ORAL_TABLET | Freq: Every day | ORAL | Status: DC
  Filled 2018-10-17 (×2): qty 1

## 2018-10-17 NOTE — Progress Notes (Signed)
[facility] NOVEL CORONAVIRUS (COVID-19) DAILY CHECK-OFF SYMPTOMS - answer yes or no to each - every day NO YES  Have you had a fever in the past 24 hours?  . Fever (Temp > 37.80C / 100F) X   Have you had any of these symptoms in the past 24 hours? . New Cough .  Sore Throat  .  Shortness of Breath .  Difficulty Breathing .  Unexplained Body Aches   X   Have you had any one of these symptoms in the past 24 hours not related to allergies?   . Runny Nose .  Nasal Congestion .  Sneezing   X   If you have had runny nose, nasal congestion, sneezing in the past 24 hours, has it worsened?  X   EXPOSURES - check yes or no X   Have you traveled outside the state in the past 14 days?  X   Have you been in contact with someone with a confirmed diagnosis of COVID-19 or PUI in the past 14 days without wearing appropriate PPE?  X   Have you been living in the same home as a person with confirmed diagnosis of COVID-19 or a PUI (household contact)?    X   Have you been diagnosed with COVID-19?    X              What to do next: Answered NO to all: Answered YES to anything:   Proceed with unit schedule Follow the [facility] Inpatient Flowsheet.   
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
## 2018-10-17 NOTE — Plan of Care (Signed)
  Problem: Safety: Goal: Periods of time without injury will increase Outcome: Progressing Note:  Pt has not harmed self or others tonight.  He denies SI/HI and verbally contracts for safety.   
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
## 2018-10-17 NOTE — Progress Notes (Addendum)
Patient is experiencing chest pain going down left arm and up into left side of neck. Patient rates pain 8/10. Patient feels short of breath. Patient is concerned because he was told he was having a mild heart attack when in the hospital before coming to [facility]. Provider on call consulted and wants patient transferred to medical hospital.

## 2018-10-17 NOTE — Tx Team (Signed)
Interdisciplinary Treatment and Diagnostic Plan Update  10/17/2018 Time of Session: 9:04am Kevin Schaefer. MRN: [id number]  Principal Diagnosis: Methamphetamine abuse ([place])  Secondary Diagnoses: Principal Problem:   Methamphetamine abuse ([place]) Active Problems:   MDD (major depressive disorder), recurrent severe, without psychosis ([place])   Current Medications:  Current Facility-Administered Medications  Medication Dose Route Frequency Provider Last Rate Last Dose  . acetaminophen (TYLENOL) tablet 650 mg  650 mg Oral Q6H PRN Ethelene Hal, NP      . alum & mag hydroxide-simeth (MAALOX/MYLANTA) 200-200-20 MG/5ML suspension 30 mL  30 mL Oral Q4H PRN Ethelene Hal, NP      . colchicine tablet 0.6 mg  0.6 mg Oral BID Ethelene Hal, NP   0.6 mg at 10/17/18 0827  . docusate sodium (COLACE) capsule 100 mg  100 mg Oral Daily Ethelene Hal, NP   100 mg at 10/17/18 0827  . ibuprofen (ADVIL) tablet 600 mg  600 mg Oral QID Sharma Covert, MD   600 mg at 10/17/18 1206  . magnesium hydroxide (MILK OF MAGNESIA) suspension 30 mL  30 mL Oral Daily PRN Ethelene Hal, NP      . neomycin-bacitracin-polymyxin (NEOSPORIN) ointment   Topical BID Sharma Covert, MD      . traZODone (DESYREL) tablet 100 mg  100 mg Oral QHS Connye Burkitt, NP       PTA Medications: No medications prior to admission.    Patient Stressors: Financial difficulties Occupational concerns Substance abuse Other: "my dog is missing since about 4 days ago"   Patient Strengths: Average or above average intelligence Communication skills General fund of knowledge Religious Affiliation Supportive family/friends  Treatment Modalities: Medication Management, Group therapy, Case management,  1 to 1 session with clinician, Psychoeducation, Recreational therapy.   Physician Treatment Plan for Primary Diagnosis: Methamphetamine abuse ([place]) Long Term Goal(s): Improvement in symptoms so as  ready for discharge Improvement in symptoms so as ready for discharge   Short Term Goals: Ability to identify changes in lifestyle to reduce recurrence of condition will improve Ability to verbalize feelings will improve Ability to disclose and discuss suicidal ideas Ability to demonstrate self-control will improve Ability to identify and develop effective coping behaviors will improve Ability to maintain clinical measurements within normal limits will improve Ability to identify triggers associated with substance abuse/mental health issues will improve Ability to identify changes in lifestyle to reduce recurrence of condition will improve Ability to verbalize feelings will improve Ability to disclose and discuss suicidal ideas Ability to demonstrate self-control will improve Ability to identify and develop effective coping behaviors will improve Ability to maintain clinical measurements within normal limits will improve Ability to identify triggers associated with substance abuse/mental health issues will improve  Medication Management: Evaluate patient's response, side effects, and tolerance of medication regimen.  Therapeutic Interventions: 1 to 1 sessions, Unit Group sessions and Medication administration.  Evaluation of Outcomes: Progressing  Physician Treatment Plan for Secondary Diagnosis: Principal Problem:   Methamphetamine abuse ([place]) Active Problems:   MDD (major depressive disorder), recurrent severe, without psychosis ([place])  Long Term Goal(s): Improvement in symptoms so as ready for discharge Improvement in symptoms so as ready for discharge   Short Term Goals: Ability to identify changes in lifestyle to reduce recurrence of condition will improve Ability to verbalize feelings will improve Ability to disclose and discuss suicidal ideas Ability to demonstrate self-control will improve Ability to identify and develop effective coping behaviors will improve Ability to  maintain clinical measurements within normal limits will improve Ability to identify triggers associated with substance abuse/mental health issues will improve Ability to identify changes in lifestyle to reduce recurrence of condition will improve Ability to verbalize feelings will improve Ability to disclose and discuss suicidal ideas Ability to demonstrate self-control will improve Ability to identify and develop effective coping behaviors will improve Ability to maintain clinical measurements within normal limits will improve Ability to identify triggers associated with substance abuse/mental health issues will improve     Medication Management: Evaluate patient's response, side effects, and tolerance of medication regimen.  Therapeutic Interventions: 1 to 1 sessions, Unit Group sessions and Medication administration.  Evaluation of Outcomes: Progressing   RN Treatment Plan for Primary Diagnosis: Methamphetamine abuse ([place]) Long Term Goal(s): Knowledge of disease and therapeutic regimen to maintain health will improve  Short Term Goals: Ability to participate in decision making will improve, Ability to verbalize feelings will improve, Ability to disclose and discuss suicidal ideas, Ability to identify and develop effective coping behaviors will improve and Compliance with prescribed medications will improve  Medication Management: RN will administer medications as ordered by provider, will assess and evaluate patient's response and provide education to patient for prescribed medication. RN will report any adverse and/or side effects to prescribing provider.  Therapeutic Interventions: 1 on 1 counseling sessions, Psychoeducation, Medication administration, Evaluate responses to treatment, Monitor vital signs and CBGs as ordered, Perform/monitor CIWA, COWS, AIMS and Fall Risk screenings as ordered, Perform wound care treatments as ordered.  Evaluation of Outcomes: Progressing   LCSW  Treatment Plan for Primary Diagnosis: Methamphetamine abuse ([place]) Long Term Goal(s): Safe transition to appropriate next level of care at discharge, Engage patient in therapeutic group addressing interpersonal concerns.  Short Term Goals: Engage patient in aftercare planning with referrals and resources and Increase skills for wellness and recovery  Therapeutic Interventions: Assess for all discharge needs, 1 to 1 time with Social worker, Explore available resources and support systems, Assess for adequacy in community support network, Educate family and significant other(s) on suicide prevention, Complete Psychosocial Assessment, Interpersonal group therapy.  Evaluation of Outcomes: Progressing   Progress in Treatment: Attending groups: No. Participating in groups: No. Taking medication as prescribed: Yes. Toleration medication: Yes. Family/Significant other contact made: No, will contact:  will contact mother Patient understands diagnosis: Yes. Discussing patient identified problems/goals with staff: Yes. Medical problems stabilized or resolved: Yes. Denies suicidal/homicidal ideation: Yes. Issues/concerns per patient self-inventory: No. Other:   New problem(s) identified: No, Describe:  None  New Short Term/Long Term Goal(s):  Patient Goals:  "Rest and heal up"  Discharge Plan or Barriers:   Reason for Continuation of Hospitalization: Medication stabilization Withdrawal symptoms  Estimated Length of Stay: 2-3 days   Attendees: Patient: 10/17/2018  Physician:Dr. Myles Lipps, MD 10/17/2018  Nursing:Skylee, RN 10/17/2018  RN Care Manager: 10/17/2018  Social Worker:Drayke Grabel Rosana Hoes, [place] 10/17/2018  Recreational Therapist: 10/17/2018  Other: 10/17/2018  Other: 10/17/2018  Other: 10/17/2018      Scribe for Treatment Team: Trecia Rogers, LCSW 10/17/2018 12:52 PM

## 2018-10-18 ENCOUNTER — Encounter (HOSPITAL_COMMUNITY): Payer: Self-pay | Admitting: Radiology

## 2018-10-18 ENCOUNTER — Encounter (HOSPITAL_COMMUNITY): Payer: Self-pay | Admitting: General Practice

## 2018-10-18 ENCOUNTER — Inpatient Hospital Stay (HOSPITAL_COMMUNITY): Payer: Medicaid Other

## 2018-10-18 ENCOUNTER — Inpatient Hospital Stay (HOSPITAL_COMMUNITY)
Admission: AD | Admit: 2018-10-18 | Discharge: 2018-10-20 | DRG: 315 | Disposition: A | Discharge status: Other Acute Inpatient Hospital | Payer: Self-pay | Source: Ambulatory Visit | Attending: Internal Medicine | Admitting: Internal Medicine

## 2018-10-18 ENCOUNTER — Encounter (HOSPITAL_COMMUNITY)
Admission: AD | Disposition: A | Discharge status: Other Acute Inpatient Hospital | Payer: Self-pay | Source: Ambulatory Visit | Attending: Internal Medicine

## 2018-10-18 DIAGNOSIS — I300 Acute nonspecific idiopathic pericarditis: Principal | ICD-10-CM

## 2018-10-18 DIAGNOSIS — F909 Attention-deficit hyperactivity disorder, unspecified type: Secondary | ICD-10-CM | POA: Diagnosis present

## 2018-10-18 DIAGNOSIS — R45851 Suicidal ideations: Secondary | ICD-10-CM | POA: Diagnosis present

## 2018-10-18 DIAGNOSIS — F17210 Nicotine dependence, cigarettes, uncomplicated: Secondary | ICD-10-CM | POA: Diagnosis present

## 2018-10-18 DIAGNOSIS — F1510 Other stimulant abuse, uncomplicated: Secondary | ICD-10-CM | POA: Diagnosis present

## 2018-10-18 DIAGNOSIS — Z8249 Family history of ischemic heart disease and other diseases of the circulatory system: Secondary | ICD-10-CM

## 2018-10-18 DIAGNOSIS — I3139 Other pericardial effusion (noninflammatory): Secondary | ICD-10-CM | POA: Diagnosis present

## 2018-10-18 DIAGNOSIS — F332 Major depressive disorder, recurrent severe without psychotic features: Secondary | ICD-10-CM | POA: Diagnosis present

## 2018-10-18 DIAGNOSIS — I313 Pericardial effusion (noninflammatory): Secondary | ICD-10-CM

## 2018-10-18 DIAGNOSIS — Z885 Allergy status to narcotic agent status: Secondary | ICD-10-CM

## 2018-10-18 DIAGNOSIS — Z7289 Other problems related to lifestyle: Secondary | ICD-10-CM

## 2018-10-18 DIAGNOSIS — IMO0002 Reserved for concepts with insufficient information to code with codable children: Secondary | ICD-10-CM

## 2018-10-18 DIAGNOSIS — I10 Essential (primary) hypertension: Secondary | ICD-10-CM | POA: Diagnosis present

## 2018-10-18 DIAGNOSIS — R079 Chest pain, unspecified: Secondary | ICD-10-CM

## 2018-10-18 HISTORY — DX: Pericardial effusion (noninflammatory): I31.3

## 2018-10-18 HISTORY — DX: Other pericardial effusion (noninflammatory): I31.39

## 2018-10-18 HISTORY — DX: Major depressive disorder, single episode, unspecified: F32.9

## 2018-10-18 LAB — CBC WITH DIFFERENTIAL/PLATELET
Abs Immature Granulocytes: 0.06 10*3/uL (ref 0.00–0.07)
Basophils Absolute: 0.1 10*3/uL (ref 0.0–0.1)
Basophils Relative: 1 %
Eosinophils Absolute: 0.4 10*3/uL (ref 0.0–0.5)
Eosinophils Relative: 3 %
HCT: 30.8 % — ABNORMAL LOW (ref 39.0–52.0)
Hemoglobin: 10.2 g/dL — ABNORMAL LOW (ref 13.0–17.0)
Immature Granulocytes: 1 %
Lymphocytes Relative: 14 %
Lymphs Abs: 1.7 10*3/uL (ref 0.7–4.0)
MCH: 30.4 pg (ref 26.0–34.0)
MCHC: 33.1 g/dL (ref 30.0–36.0)
MCV: 91.9 fL (ref 80.0–100.0)
Monocytes Absolute: 2.1 10*3/uL — ABNORMAL HIGH (ref 0.1–1.0)
Monocytes Relative: 17 %
Neutro Abs: 8.2 10*3/uL — ABNORMAL HIGH (ref 1.7–7.7)
Neutrophils Relative %: 64 %
Platelets: 351 10*3/uL (ref 150–400)
RBC: 3.35 MIL/uL — ABNORMAL LOW (ref 4.22–5.81)
RDW: 12.2 % (ref 11.5–15.5)
WBC: 12.5 10*3/uL — ABNORMAL HIGH (ref 4.0–10.5)
nRBC: 0 % (ref 0.0–0.2)

## 2018-10-18 LAB — COMPREHENSIVE METABOLIC PANEL
ALT: 27 U/L (ref 0–44)
AST: 27 U/L (ref 15–41)
Albumin: 2.8 g/dL — ABNORMAL LOW (ref 3.5–5.0)
Alkaline Phosphatase: 81 U/L (ref 38–126)
Anion gap: 10 (ref 5–15)
BUN: 10 mg/dL (ref 6–20)
CO2: 25 mmol/L (ref 22–32)
Calcium: 8.6 mg/dL — ABNORMAL LOW (ref 8.9–10.3)
Chloride: 101 mmol/L (ref 98–111)
Creatinine, Ser: 0.66 mg/dL (ref 0.61–1.24)
GFR calc Af Amer: >60 mL/min (ref >60)
GFR calc non Af Amer: >60 mL/min (ref >60)
Glucose, Bld: 112 mg/dL — ABNORMAL HIGH (ref 70–99)
Potassium: 3.6 mmol/L (ref 3.5–5.1)
Sodium: 136 mmol/L (ref 135–145)
Total Bilirubin: 0.3 mg/dL (ref 0.3–1.2)
Total Protein: 6.2 g/dL — ABNORMAL LOW (ref 6.5–8.1)

## 2018-10-18 LAB — C-REACTIVE PROTEIN: CRP: 10.5 mg/dL — ABNORMAL HIGH (ref <1.0)

## 2018-10-18 LAB — TROPONIN I
Troponin I: <0.03 ng/mL (ref <0.03)
Troponin I: <0.03 ng/mL (ref <0.03)

## 2018-10-18 LAB — D-DIMER, QUANTITATIVE: D-Dimer, Quant: 2.82 ug/mL-FEU — ABNORMAL HIGH (ref 0.00–0.50)

## 2018-10-18 LAB — SEDIMENTATION RATE: Sed Rate: 61 mm/hr — ABNORMAL HIGH (ref 0–16)

## 2018-10-18 LAB — BRAIN NATRIURETIC PEPTIDE: B Natriuretic Peptide: 94.5 pg/mL (ref 0.0–100.0)

## 2018-10-18 SURGERY — PERICARDIOCENTESIS
Anesthesia: LOCAL

## 2018-10-18 MED ORDER — MORPHINE SULFATE (PF) 4 MG/ML IV SOLN
4.0000 mg | Freq: Once | INTRAVENOUS | Status: AC
  Administered 2018-10-18: 03:00:00 4 mg via INTRAVENOUS
  Filled 2018-10-18: qty 1

## 2018-10-18 MED ORDER — ONDANSETRON HCL 4 MG PO TABS: 4.0000 mg | ORAL_TABLET | Freq: Four times a day (QID) | ORAL | Status: DC | PRN

## 2018-10-18 MED ORDER — TRAZODONE HCL 100 MG PO TABS
100.0000 mg | ORAL_TABLET | Freq: Every day | ORAL | Status: DC
  Filled 2018-10-18: qty 1

## 2018-10-18 MED ORDER — IBUPROFEN 600 MG PO TABS
600.0000 mg | ORAL_TABLET | Freq: Four times a day (QID) | ORAL | Status: DC
  Administered 2018-10-18 – 2018-10-19 (×5): 600 mg via ORAL
  Filled 2018-10-18 (×5): qty 1

## 2018-10-18 MED ORDER — ONDANSETRON HCL 4 MG/2ML IJ SOLN: 4.0000 mg | Freq: Four times a day (QID) | INTRAMUSCULAR | Status: DC | PRN

## 2018-10-18 MED ORDER — ACETAMINOPHEN 325 MG PO TABS: 650.0000 mg | ORAL_TABLET | Freq: Four times a day (QID) | ORAL | Status: DC | PRN

## 2018-10-18 MED ORDER — IOHEXOL 350 MG/ML SOLN
80.0000 mL | Freq: Once | INTRAVENOUS | Status: AC | PRN
  Administered 2018-10-18: 02:00:00 80 mL via INTRAVENOUS

## 2018-10-18 MED ORDER — COLCHICINE 0.6 MG PO TABS
0.6000 mg | ORAL_TABLET | Freq: Once | ORAL | Status: AC
  Administered 2018-10-18: 03:00:00 0.6 mg via ORAL
  Filled 2018-10-18: qty 1

## 2018-10-18 MED ORDER — ACETAMINOPHEN 650 MG RE SUPP: 650.0000 mg | Freq: Four times a day (QID) | RECTAL | Status: DC | PRN

## 2018-10-18 MED ORDER — COLCHICINE 0.6 MG PO TABS
0.6000 mg | ORAL_TABLET | Freq: Two times a day (BID) | ORAL | Status: DC
  Administered 2018-10-18 – 2018-10-20 (×5): 0.6 mg via ORAL
  Filled 2018-10-18 (×5): qty 1
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
## 2018-10-18 NOTE — Clinical Social Work Note (Addendum)
Per documentation, patient is under IVC. Patient admitted to [facility] on Saturday 5/23 voluntarily per [facility] admissions coordinator. CSW located a "[place] [employer], Involuntary Commitment - Service and Observation Form" scanned into the chart last night with no end date. [facility] admissions coordinator is unsure if this was just for transport to [facility] This was not the typical IVC form we use and there was no end date on it. CSW Research scientist (medical) to get clarity on if patient is under actual IVC or not.  Day[place] Scrape, [place] [phone]  11:11 am Received email reply from Lenor Derrick at the [facility] She did not see any IVC paperwork for patient and he is not in their log. She advised that CSW call the criminal office to check their log since they manage on weekends/holidays/after hours. They do not have him in their log either. Sent message to MD and RN to notify. Told them to notify CSW if patient becomes suicidal/homicidal/psychotic/trying to leave and new paperwork will be drawn up. Psych will likely have to see patient closer to discharge to determine if he needs to return to [facility] once stable or not.  Dayton Scrape, [place]

## 2018-10-18 NOTE — ED Notes (Signed)
AC notified of need for sitter for IVC

## 2018-10-18 NOTE — H&P (Deleted)
  The note originally documented on this encounter has been moved the the encounter in which it belongs.  
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
## 2018-10-19 ENCOUNTER — Inpatient Hospital Stay (HOSPITAL_COMMUNITY): Payer: Self-pay

## 2018-10-19 DIAGNOSIS — I313 Pericardial effusion (noninflammatory): Secondary | ICD-10-CM

## 2018-10-19 DIAGNOSIS — F322 Major depressive disorder, single episode, severe without psychotic features: Secondary | ICD-10-CM

## 2018-10-19 LAB — ECHOCARDIOGRAM LIMITED
Height: 72 in
Weight: 2718.4 oz

## 2018-10-19 MED ORDER — IBUPROFEN 600 MG PO TABS
600.0000 mg | ORAL_TABLET | Freq: Four times a day (QID) | ORAL | Status: DC
  Administered 2018-10-19 – 2018-10-20 (×4): 600 mg via ORAL
  Filled 2018-10-19 (×5): qty 1

## 2018-10-19 NOTE — Progress Notes (Signed)
Pt resting in bed, suicide sitter at bedside. Pt denies feelings to harm self or others.
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
## 2018-10-19 NOTE — Clinical Social Work Note (Signed)
Per MD, patient stable for discharge. Called [facility] and spoke to [place]. She will review chart and notify CSW when there is a bed available.  Dayton Scrape, [place]

## 2018-10-19 NOTE — Progress Notes (Signed)
  Echocardiogram 2D Echocardiogram has been performed.  Jennette Dubin 10/19/2018, 10:15 AM
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
## 2018-10-20 ENCOUNTER — Other Ambulatory Visit: Payer: Self-pay

## 2018-10-20 ENCOUNTER — Other Ambulatory Visit: Payer: Self-pay | Admitting: Behavioral Health

## 2018-10-20 ENCOUNTER — Encounter (HOSPITAL_COMMUNITY): Payer: Self-pay | Admitting: *Deleted

## 2018-10-20 ENCOUNTER — Inpatient Hospital Stay (HOSPITAL_COMMUNITY)
Admission: AD | Admit: 2018-10-20 | Discharge: 2018-10-24 | DRG: 897 | Disposition: A | Discharge status: Home / Self Care | Payer: No Typology Code available for payment source | Source: Intra-hospital | Attending: Psychiatry | Admitting: Psychiatry

## 2018-10-20 DIAGNOSIS — T1491XA Suicide attempt, initial encounter: Secondary | ICD-10-CM | POA: Diagnosis present

## 2018-10-20 DIAGNOSIS — F339 Major depressive disorder, recurrent, unspecified: Secondary | ICD-10-CM | POA: Diagnosis present

## 2018-10-20 DIAGNOSIS — F17210 Nicotine dependence, cigarettes, uncomplicated: Secondary | ICD-10-CM | POA: Diagnosis present

## 2018-10-20 DIAGNOSIS — Z7289 Other problems related to lifestyle: Secondary | ICD-10-CM

## 2018-10-20 DIAGNOSIS — I313 Pericardial effusion (noninflammatory): Secondary | ICD-10-CM | POA: Diagnosis present

## 2018-10-20 DIAGNOSIS — F1520 Other stimulant dependence, uncomplicated: Secondary | ICD-10-CM

## 2018-10-20 DIAGNOSIS — F332 Major depressive disorder, recurrent severe without psychotic features: Secondary | ICD-10-CM | POA: Diagnosis present

## 2018-10-20 DIAGNOSIS — F329 Major depressive disorder, single episode, unspecified: Secondary | ICD-10-CM | POA: Insufficient documentation

## 2018-10-20 DIAGNOSIS — K219 Gastro-esophageal reflux disease without esophagitis: Secondary | ICD-10-CM | POA: Diagnosis present

## 2018-10-20 DIAGNOSIS — F1524 Other stimulant dependence with stimulant-induced mood disorder: Secondary | ICD-10-CM | POA: Diagnosis present

## 2018-10-20 DIAGNOSIS — G4700 Insomnia, unspecified: Secondary | ICD-10-CM | POA: Diagnosis present

## 2018-10-20 DIAGNOSIS — F1994 Other psychoactive substance use, unspecified with psychoactive substance-induced mood disorder: Principal | ICD-10-CM | POA: Diagnosis present

## 2018-10-20 DIAGNOSIS — X781XXA Intentional self-harm by knife, initial encounter: Secondary | ICD-10-CM | POA: Diagnosis present

## 2018-10-20 LAB — RENAL FUNCTION PANEL
Albumin: 2.4 g/dL — ABNORMAL LOW (ref 3.5–5.0)
Anion gap: 9 (ref 5–15)
BUN: 9 mg/dL (ref 6–20)
CO2: 25 mmol/L (ref 22–32)
Calcium: 8.4 mg/dL — ABNORMAL LOW (ref 8.9–10.3)
Chloride: 105 mmol/L (ref 98–111)
Creatinine, Ser: 0.73 mg/dL (ref 0.61–1.24)
GFR calc Af Amer: >60 mL/min (ref >60)
GFR calc non Af Amer: >60 mL/min (ref >60)
Glucose, Bld: 101 mg/dL — ABNORMAL HIGH (ref 70–99)
Phosphorus: 3.4 mg/dL (ref 2.5–4.6)
Potassium: 3.8 mmol/L (ref 3.5–5.1)
Sodium: 139 mmol/L (ref 135–145)

## 2018-10-20 LAB — CBC WITH DIFFERENTIAL/PLATELET
Abs Immature Granulocytes: 0.07 10*3/uL (ref 0.00–0.07)
Basophils Absolute: 0.1 10*3/uL (ref 0.0–0.1)
Basophils Relative: 1 %
Eosinophils Absolute: 0.3 10*3/uL (ref 0.0–0.5)
Eosinophils Relative: 3 %
HCT: 29.7 % — ABNORMAL LOW (ref 39.0–52.0)
Hemoglobin: 9.8 g/dL — ABNORMAL LOW (ref 13.0–17.0)
Immature Granulocytes: 1 %
Lymphocytes Relative: 17 %
Lymphs Abs: 1.6 10*3/uL (ref 0.7–4.0)
MCH: 30.1 pg (ref 26.0–34.0)
MCHC: 33 g/dL (ref 30.0–36.0)
MCV: 91.1 fL (ref 80.0–100.0)
Monocytes Absolute: 1.4 10*3/uL — ABNORMAL HIGH (ref 0.1–1.0)
Monocytes Relative: 14 %
Neutro Abs: 6.2 10*3/uL (ref 1.7–7.7)
Neutrophils Relative %: 64 %
Platelets: 405 10*3/uL — ABNORMAL HIGH (ref 150–400)
RBC: 3.26 MIL/uL — ABNORMAL LOW (ref 4.22–5.81)
RDW: 12.3 % (ref 11.5–15.5)
WBC: 9.6 10*3/uL (ref 4.0–10.5)
nRBC: 0 % (ref 0.0–0.2)

## 2018-10-20 LAB — MAGNESIUM: Magnesium: 1.9 mg/dL (ref 1.7–2.4)

## 2018-10-20 MED ORDER — ALUM & MAG HYDROXIDE-SIMETH 200-200-20 MG/5ML PO SUSP: 30.0000 mL | ORAL | Status: DC | PRN

## 2018-10-20 MED ORDER — IBUPROFEN 600 MG PO TABS: 600.0000 mg | ORAL_TABLET | Freq: Four times a day (QID) | ORAL | 0 refills | Status: AC

## 2018-10-20 MED ORDER — TRAZODONE HCL 100 MG PO TABS: 100.0000 mg | ORAL_TABLET | Freq: Every day | ORAL | 0 refills | Status: DC

## 2018-10-20 MED ORDER — COLCHICINE 0.6 MG PO TABS: 0.6000 mg | ORAL_TABLET | Freq: Two times a day (BID) | ORAL | 0 refills | Status: DC

## 2018-10-20 MED ORDER — COLCHICINE 0.6 MG PO TABS
0.6000 mg | ORAL_TABLET | Freq: Two times a day (BID) | ORAL | Status: DC
  Administered 2018-10-20 – 2018-10-24 (×8): 0.6 mg via ORAL
  Filled 2018-10-20 (×13): qty 1

## 2018-10-20 NOTE — Tx Team (Signed)
Initial Treatment Plan 10/20/2018 [address] [facility] Kevin Schaefer. KLK:[id number]    PATIENT STRESSORS: Substance abuse   PATIENT STRENGTHS: Average or above average intelligence Communication skills General fund of knowledge Motivation for treatment/growth Supportive family/friends   PATIENT IDENTIFIED PROBLEMS:     "I don't know. Maybe I need medications?"    "I need to identify what triggers me."             DISCHARGE CRITERIA:  Improved stabilization in mood, thinking, and/or behavior Need for constant or close observation no longer present Reduction of life-threatening or endangering symptoms to within safe limits Verbal commitment to aftercare and medication compliance  PRELIMINARY DISCHARGE PLAN: Attend 12-step recovery group Outpatient therapy Return to previous living arrangement  PATIENT/FAMILY INVOLVEMENT: This treatment plan has been presented to and reviewed with the patient, Kevin Rueb., and/or family member.  The patient and family have been given the opportunity to ask questions and make suggestions.  [name], RN 10/20/2018, [phone]
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
## 2018-10-20 NOTE — TOC Transition Note (Signed)
Transition of Care (TOC) - CM/SW Discharge Note   Patient Details  Name: Kevin Schaefer. MRN: [id number] Date of Birth: December 14, 1980  [facility]) CM/SW Contact:  Candie Chroman, LCSW Phone Number: 10/20/2018, 3:44 PM   Clinical Narrative: CSW facilitated patient discharge including contacting facility to confirm patient discharge plans. Clinical information faxed to facility and family agreeable with plan. CSW arranged ambulance transport via [facility] to [facility] at 7:45 pm. His bed will be ready at 8:00. RN to call report prior to discharge [phone] Room 402 Bed 1. Accepting MD is Dr. Parke Poisson).  CSW will sign off for now as social work intervention is no longer needed. Please consult [place] again if new needs arise.  Final next level of care: Psychiatric Hospital Barriers to Discharge: Barriers Resolved   Patient Goals and CMS Choice Patient states their goals for this hospitalization and ongoing recovery are:: "I want to go home."      Discharge Placement              Patient chooses bed at: [facility]) Patient to be transferred to facility by: [facility] Name of family member notified: Patient said he would call family Patient and family notified of of transfer: 10/20/18  Discharge Plan and Services                                     Social Determinants of Health (SDOH) Interventions     Readmission Risk Interventions No flowsheet data found.
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
## 2018-10-20 NOTE — Progress Notes (Signed)
Report given to [facility] at [facility]. Patient leaving tonight  @ 745pm per CSW.
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
## 2018-10-21 DIAGNOSIS — F1994 Other psychoactive substance use, unspecified with psychoactive substance-induced mood disorder: Secondary | ICD-10-CM | POA: Diagnosis present

## 2018-10-21 DIAGNOSIS — Z7289 Other problems related to lifestyle: Secondary | ICD-10-CM

## 2018-10-21 MED ORDER — LAMOTRIGINE 25 MG PO TABS
25.0000 mg | ORAL_TABLET | Freq: Every day | ORAL | Status: DC
  Administered 2018-10-21 – 2018-10-24 (×4): 25 mg via ORAL
  Filled 2018-10-21 (×7): qty 1

## 2018-10-21 MED ORDER — IBUPROFEN 600 MG PO TABS
600.0000 mg | ORAL_TABLET | Freq: Four times a day (QID) | ORAL | Status: DC
  Administered 2018-10-21 – 2018-10-24 (×8): 600 mg via ORAL
  Filled 2018-10-21 (×20): qty 1

## 2018-10-21 MED ORDER — PANTOPRAZOLE SODIUM 40 MG PO TBEC
40.0000 mg | DELAYED_RELEASE_TABLET | Freq: Every day | ORAL | Status: DC
  Administered 2018-10-21 – 2018-10-24 (×4): 40 mg via ORAL
  Filled 2018-10-21 (×6): qty 1

## 2018-10-21 NOTE — [facility] Group Notes (Signed)
LCSW Group Therapy Note 10/21/2018 12:14 PM  Type of Therapy and Topic: Group Therapy: Overcoming Obstacles  Participation Level: Active  Description of Group:  In this group patients will be encouraged to explore what they see as obstacles to their own wellness and recovery. They will be guided to discuss their thoughts, feelings, and behaviors related to these obstacles. The group will process together ways to cope with barriers, with attention given to specific choices patients can make. Each patient will be challenged to identify changes they are motivated to make in order to overcome their obstacles. This group will be process-oriented, with patients participating in exploration of their own experiences as well as giving and receiving support and challenge from other group members.  Therapeutic Goals: 1. Patient will identify personal and current obstacles as they relate to admission. 2. Patient will identify barriers that currently interfere with their wellness or overcoming obstacles.  3. Patient will identify feelings, thought process and behaviors related to these barriers. 4. Patient will identify two changes they are willing to make to overcome these obstacles:   Summary of Patient Progress  Kevin Schaefer was engaged and participated throughout the group session. Kevin Schaefer reports that his main obstacle is unemployment. He identified lack of transportation and required documentation as his main barriers Kevin Schaefer reports that he plans to address those barriers once he discharges.    Therapeutic Modalities:  Cognitive Behavioral Therapy Solution Focused Therapy Motivational Interviewing Relapse Prevention Therapy   Theresa Duty Clinical Social Worker

## 2018-10-21 NOTE — Tx Team (Signed)
Interdisciplinary Treatment and Diagnostic Plan Update  10/21/2018 Time of Session:  Kevin Schaefer. MRN: [id number]  Principal Diagnosis: <principal problem not specified>  Secondary Diagnoses: Active Problems:   MDD (major depressive disorder)   Current Medications:  Current Facility-Administered Medications  Medication Dose Route Frequency Provider Last Rate Last Dose  . alum & mag hydroxide-simeth (MAALOX/MYLANTA) 200-200-20 MG/5ML suspension 30 mL  30 mL Oral Q4H PRN Mordecai Maes, NP      . colchicine tablet 0.6 mg  0.6 mg Oral BID Lindon Romp A, NP   0.6 mg at 10/21/18 0818   PTA Medications: Medications Prior to Admission  Medication Sig Dispense Refill Last Dose  . colchicine 0.6 MG tablet Take 1 tablet (0.6 mg total) by mouth 2 (two) times daily for 30 days. 60 tablet 0   . ibuprofen (ADVIL) 600 MG tablet Take 1 tablet (600 mg total) by mouth every 6 (six) hours. 30 tablet 0   . traZODone (DESYREL) 100 MG tablet Take 1 tablet (100 mg total) by mouth at bedtime for 30 days. 30 tablet 0     Patient Stressors: Substance abuse  Patient Strengths: Average or above average intelligence Communication skills General fund of knowledge Motivation for treatment/growth Supportive family/friends  Treatment Modalities: Medication Management, Group therapy, Case management,  1 to 1 session with clinician, Psychoeducation, Recreational therapy.   Physician Treatment Plan for Primary Diagnosis: <principal problem not specified> Long Term Goal(s):     Short Term Goals:    Medication Management: Evaluate patient's response, side effects, and tolerance of medication regimen.  Therapeutic Interventions: 1 to 1 sessions, Unit Group sessions and Medication administration.  Evaluation of Outcomes: Not Met  Physician Treatment Plan for Secondary Diagnosis: Active Problems:   MDD (major depressive disorder)  Long Term Goal(s):     Short Term Goals:       Medication  Management: Evaluate patient's response, side effects, and tolerance of medication regimen.  Therapeutic Interventions: 1 to 1 sessions, Unit Group sessions and Medication administration.  Evaluation of Outcomes: Not Met   RN Treatment Plan for Primary Diagnosis: <principal problem not specified> Long Term Goal(s): Knowledge of disease and therapeutic regimen to maintain health will improve  Short Term Goals: Ability to remain free from injury will improve, Ability to participate in decision making will improve, Ability to verbalize feelings will improve, Ability to disclose and discuss suicidal ideas and Ability to identify and develop effective coping behaviors will improve  Medication Management: RN will administer medications as ordered by provider, will assess and evaluate patient's response and provide education to patient for prescribed medication. RN will report any adverse and/or side effects to prescribing provider.  Therapeutic Interventions: 1 on 1 counseling sessions, Psychoeducation, Medication administration, Evaluate responses to treatment, Monitor vital signs and CBGs as ordered, Perform/monitor CIWA, COWS, AIMS and Fall Risk screenings as ordered, Perform wound care treatments as ordered.  Evaluation of Outcomes: Not Met   LCSW Treatment Plan for Primary Diagnosis: <principal problem not specified> Long Term Goal(s): Safe transition to appropriate next level of care at discharge, Engage patient in therapeutic group addressing interpersonal concerns.  Short Term Goals: Engage patient in aftercare planning with referrals and resources  Therapeutic Interventions: Assess for all discharge needs, 1 to 1 time with Social worker, Explore available resources and support systems, Assess for adequacy in community support network, Educate family and significant other(s) on suicide prevention, Complete Psychosocial Assessment, Interpersonal group therapy.  Evaluation of Outcomes: Not  Met   Progress  in Treatment: Attending groups: Yes. Participating in groups: Yes. Taking medication as prescribed: Yes. Toleration medication: Yes. Family/Significant other contact made: No, will contact:  the patient's mother Patient understands diagnosis: Yes. Discussing patient identified problems/goals with staff: Yes. Medical problems stabilized or resolved: Yes. Denies suicidal/homicidal ideation: Yes. Issues/concerns per patient self-inventory: No. Other:   New problem(s) identified: None   New Short Term/Long Term Goal(s): medication stabilization, elimination of SI thoughts, development of comprehensive mental wellness plan.    Patient Goals:    Discharge Plan or Barriers: Patient plans to return home with his mother in [place], [place]. He expressed interest in outpatient follow up referrals. CSW will continue to follow and assess for any referrals and discharge planning.   Reason for Continuation of Hospitalization: Depression Medication stabilization Suicidal ideation  Estimated Length of Stay: 3-5 days   Attendees: Patient: 10/21/2018 12:32 PM  Physician: Dr. Neita Garnet, MD 10/21/2018 12:32 PM  Nursing: Chong Sicilian.D, RN 10/21/2018 12:32 PM  RN Care Manager: 10/21/2018 12:32 PM  Social Worker: Radonna Ricker, [place] 10/21/2018 12:32 PM  Recreational Therapist:  10/21/2018 12:32 PM  Other:  10/21/2018 12:32 PM  Other:  10/21/2018 12:32 PM  Other: 10/21/2018 12:32 PM    Scribe for Treatment Team: Marylee Floras, [place] 10/21/2018 12:32 PM

## 2018-10-21 NOTE — Progress Notes (Signed)
[place] NOVEL CORONAVIRUS (COVID-19) DAILY CHECK-OFF SYMPTOMS - answer yes or no to each - every day NO YES  Have you had a fever in the past 24 hours?  . Fever (Temp > 37.80C / 100F) X   Have you had any of these symptoms in the past 24 hours? . New Cough .  Sore Throat  .  Shortness of Breath .  Difficulty Breathing .  Unexplained Body Aches   X   Have you had any one of these symptoms in the past 24 hours not related to allergies?   . Runny Nose .  Nasal Congestion .  Sneezing   X   If you have had runny nose, nasal congestion, sneezing in the past 24 hours, has it worsened?  X   EXPOSURES - check yes or no X   Have you traveled outside the state in the past 14 days?  X   Have you been in contact with someone with a confirmed diagnosis of COVID-19 or PUI in the past 14 days without wearing appropriate PPE?  X   Have you been living in the same home as a person with confirmed diagnosis of COVID-19 or a PUI (household contact)?    X   Have you been diagnosed with COVID-19?    X              What to do next: Answered NO to all: Answered YES to anything:   Proceed with unit schedule Follow the [facility] Inpatient Flowsheet.   

## 2018-10-21 NOTE — Plan of Care (Signed)
  Problem: Education: Goal: Knowledge of [facility] Education information/materials will improve Outcome: Progressing   

## 2018-10-21 NOTE — Progress Notes (Signed)
Did not attend group 
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
## 2018-10-21 NOTE — Progress Notes (Signed)
[place] NOVEL CORONAVIRUS (COVID-19) DAILY CHECK-OFF SYMPTOMS - answer yes or no to each - every day NO YES  Have you had a fever in the past 24 hours?  . Fever (Temp > 37.80C / 100F) X   Have you had any of these symptoms in the past 24 hours? . New Cough .  Sore Throat  .  Shortness of Breath .  Difficulty Breathing .  Unexplained Body Aches   X   Have you had any one of these symptoms in the past 24 hours not related to allergies?   . Runny Nose .  Nasal Congestion .  Sneezing   X   If you have had runny nose, nasal congestion, sneezing in the past 24 hours, has it worsened?  X   EXPOSURES - check yes or no X   Have you traveled outside the state in the past 14 days?  X   Have you been in contact with someone with a confirmed diagnosis of COVID-19 or PUI in the past 14 days without wearing appropriate PPE?  X   Have you been living in the same home as a person with confirmed diagnosis of COVID-19 or a PUI (household contact)?    X   Have you been diagnosed with COVID-19?    X              What to do next: Answered NO to all: Answered YES to anything:   Proceed with unit schedule Follow the [facility] Inpatient Flowsheet.   
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
## 2018-10-22 DIAGNOSIS — T1491XA Suicide attempt, initial encounter: Secondary | ICD-10-CM | POA: Diagnosis present

## 2018-10-22 DIAGNOSIS — F1520 Other stimulant dependence, uncomplicated: Secondary | ICD-10-CM

## 2018-10-22 NOTE — BHH Group Notes (Signed)
LCSW Group Therapy Note  10/22/2018       Type of Therapy and Topic:  Group Therapy: "My Mental Health"  Participation Level:  Did Not Attend   Description of Group:   In this group, patients were asked four questions in order to generate discussion around the idea of mental illness and/or substance addiction being medical problems: 1. In one sentence describe the current state of your mental health or substance use. 2. How much do you feel similar to or different from others? 3. Do you tend to identify with other people or compare yourself to them?  4. In a word or sentence, share what you desire your mental health/substance use to be.  Discussion was held that led to the conclusion that comparing ourselves to others is not healthy, but identifying with the elements of their issues that are similar to ours is helpful.    Therapeutic Goals: 1. Patients will identify their feelings about their current mental health/substance use problems. 2. Patients will describe how they feel similar to or different from others, and whether they tend to identify with or compare themselves to other people with the same issues. 3. Patients will explore the differences in these concepts and how a change of mindset about mental health/substance use can help with reaching recovery goals. 4. Patients will think about and share what their recovery goals are, in terms of mental health and/or substance use.  Summary of Patient Progress:  The patient was invited to group, chose not to attend.  Therapeutic Modalities:   Processing Motivation Interviewing Psychoeducation  Maretta Los, MSW, LCSW 12:18 PM  .

## 2018-10-22 NOTE — Progress Notes (Signed)
[place] NOVEL CORONAVIRUS (COVID-19) DAILY CHECK-OFF SYMPTOMS - answer yes or no to each - every day NO YES  Have you had a fever in the past 24 hours?  . Fever (Temp > 37.80C / 100F) X   Have you had any of these symptoms in the past 24 hours? . New Cough .  Sore Throat  .  Shortness of Breath .  Difficulty Breathing .  Unexplained Body Aches   X   Have you had any one of these symptoms in the past 24 hours not related to allergies?   . Runny Nose .  Nasal Congestion .  Sneezing   X   If you have had runny nose, nasal congestion, sneezing in the past 24 hours, has it worsened?  X   EXPOSURES - check yes or no X   Have you traveled outside the state in the past 14 days?  X   Have you been in contact with someone with a confirmed diagnosis of COVID-19 or PUI in the past 14 days without wearing appropriate PPE?  X   Have you been living in the same home as a person with confirmed diagnosis of COVID-19 or a PUI (household contact)?    X   Have you been diagnosed with COVID-19?    X              What to do next: Answered NO to all: Answered YES to anything:   Proceed with unit schedule Follow the [facility] Inpatient Flowsheet.   

## 2018-10-22 NOTE — Progress Notes (Signed)
Adult Psychoeducational Group Note  Date:  10/22/2018 Time:  9:30am-10:00am   Group Topic/Focus:  Goals Group:   The focus of this group is to help patients establish daily goals to achieve during treatment and discuss how the patient can incorporate goal setting into their daily lives to aide in recovery.  Participation Level:  Active  Participation Quality:  Appropriate  Affect:  Appropriate  Cognitive:  Alert, Appropriate and Oriented  Insight: Appropriate and Good  Engagement in Group:  Engaged  Modes of Intervention:  Discussion and Support  Additional Comments:  Patient informed the group that his goal for the day is to recuperate physically and mentally. Patient informed the group that he plans to achieve this goal by taking it easy and going with the flow. Patient informed the group that he would rate his day as a 7. Patient informed the group that his day could shift to an 8 with rest and relaxation. Patient informed the group that he was looking forward to seeing the space shuttle launch.   Doyle Askew 10/22/2018, 5:08 PM

## 2018-10-22 NOTE — Progress Notes (Signed)
D Pt is observed OOB UAL on the 400 hall today. He is well dressed, clean, he is appropriate in his demeanor, he makes eye contact with this Probation officer. He endorses a flat, depressed  Affect. He calls this Probation officer by first name.     A HE completed his daily assessment and on this he wrote he denied SI today and he rated his depression, hopelessness and anxiety " 2/0/0/", respectively. He says " Im feeling a little better each day".      R Safety is in place.

## 2018-10-22 NOTE — Plan of Care (Signed)
  Problem: Education: Goal: Knowledge of [facility] Education information/materials will improve Outcome: Progressing Goal: Emotional status will improve Outcome: Progressing   
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
## 2018-10-23 NOTE — Progress Notes (Signed)
D.  Pt pleasant on approach, denies complaints at this time.  Pt receives scheduled Ibuprofen but states he does not take the after midnight dose, takes the morning dose only.  Pt denies pain at this time.  Pt was positive for evening wrap up group, observed engaged in appropriate interaction with peers on the unit.  Pt denies SI/HI/AVH at this time.  A.  Support and encouragement offered, medication given as ordered  R.  Pt remains safe on the unit, will continue to monitor.

## 2018-10-23 NOTE — Progress Notes (Addendum)
[facility] MD Progress Note  10/23/2018 11:17 AM Kevin Schaefer.  MRN:  [id number]   Subjective: Patient reports today that he is feeling much better.  He states that he is not having issues today and slept good last night and his appetite is better.  Patient denies any medication side effects.  Patient denies any suicidal or homicidal ideations and denies any hallucinations.  Patient denies any withdrawal symptoms.  Patient also reports that he did contact his mom yesterday and spoke to her for approximately an hour and a half and they work things out.  He states that he will stay with his mom after he was discharged from hospital and him and his mom and came up with a good plan.  Objective: Patient's chart and findings reviewed and discussed with treatment team.  Patient presents in the day room and is interacting with peers and staff appropriately.  Patient has been attending groups and interacting.  Patient is pleasant, calm, and cooperative.  Patient will continue current medications.  Plan is for patient to discharge tomorrow home with his mother.  Principal Problem: Methamphetamine use disorder, severe ([facility]) Diagnosis: Principal Problem:   Methamphetamine use disorder, severe ([place]) Active Problems:   MDD (major depressive disorder), recurrent severe, without psychosis ([place])   Substance induced mood disorder ([place])   Suicide attempt ([place])  Total Time spent with patient: 20 minutes  Past Psychiatric History: See H&P  Past Medical History:  Past Medical History:  Diagnosis Date  . Hypertension   . MDD (major depressive disorder)   . Pericardial effusion 10/18/2018    Past Surgical History:  Procedure Laterality Date  . LEFT HEART CATH AND CORONARY ANGIOGRAPHY N/A 10/12/2018   Procedure: LEFT HEART CATH AND CORONARY ANGIOGRAPHY;  Surgeon: Troy Sine, MD;  Location: [facility];  Service: Cardiovascular;  Laterality: N/A;  . WISDOM TOOTH EXTRACTION     Family History:  Family  History  Problem Relation Age of Onset  . Heart failure Paternal Grandmother   . CAD Neg Hx    Family Psychiatric  History: See H&P Social History:  Social History   Substance and Sexual Activity  Alcohol Use Not Currently     Social History   Substance and Sexual Activity  Drug Use Yes  . Types: Marijuana    Social History   Socioeconomic History  . Marital status: Single    Spouse name: Not on file  . Number of children: Not on file  . Years of education: Not on file  . Highest education level: Not on file  Occupational History  . Not on file  Social Needs  . Financial resource strain: Not on file  . Food insecurity:    Worry: Not on file    Inability: Not on file  . Transportation needs:    Medical: Not on file    Non-medical: Not on file  Tobacco Use  . Smoking status: Current Every Day Smoker    Packs/day: 1.00    Types: Cigarettes  . Smokeless tobacco: Never Used  Substance and Sexual Activity  . Alcohol use: Not Currently  . Drug use: Yes    Types: Marijuana  . Sexual activity: Not on file  Lifestyle  . Physical activity:    Days per week: Not on file    Minutes per session: Not on file  . Stress: Not on file  Relationships  . Social connections:    Talks on phone: Not on file  Gets together: Not on file    Attends religious service: Not on file    Active member of club or organization: Not on file    Attends meetings of clubs or organizations: Not on file    Relationship status: Not on file  Other Topics Concern  . Not on file  Social History Narrative  . Not on file   Additional Social History:                         Sleep: Good  Appetite:  Good  Current Medications: Current Facility-Administered Medications  Medication Dose Route Frequency Provider Last Rate Last Dose  . alum & mag hydroxide-simeth (MAALOX/MYLANTA) 200-200-20 MG/5ML suspension 30 mL  30 mL Oral Q4H PRN Mordecai Maes, NP      . colchicine tablet 0.6  mg  0.6 mg Oral BID Lindon Romp A, NP   0.6 mg at 10/23/18 0829  . ibuprofen (ADVIL) tablet 600 mg  600 mg Oral Q6H , Myer Peer, MD   600 mg at 10/23/18 0830  . lamoTRIgine (LAMICTAL) tablet 25 mg  25 mg Oral Daily , Myer Peer, MD   25 mg at 10/23/18 0829  . pantoprazole (PROTONIX) EC tablet 40 mg  40 mg Oral Daily , Myer Peer, MD   40 mg at 10/23/18 0830    Lab Results: No results found for this or any previous visit (from the past 45 hour(s)).  Blood Alcohol level:  No results found for: [facility]  Metabolic Disorder Labs: Lab Results  Component Value Date   HGBA1C 5.2 10/12/2018   MPG 102.54 10/12/2018   No results found for: PROLACTIN Lab Results  Component Value Date   CHOL 145 10/12/2018   TRIG 47 10/12/2018   HDL 47 10/12/2018   CHOLHDL 3.1 10/12/2018   VLDL 9 10/12/2018   LDLCALC 89 10/12/2018    Physical Findings: AIMS: Facial and Oral Movements Muscles of Facial Expression: None, normal Lips and Perioral Area: None, normal Jaw: None, normal Tongue: None, normal,Extremity Movements Upper (arms, wrists, hands, fingers): None, normal Lower (legs, knees, ankles, toes): None, normal, Trunk Movements Neck, shoulders, hips: None, normal, Overall Severity Severity of abnormal movements (highest score from questions above): None, normal Incapacitation due to abnormal movements: None, normal Patient's awareness of abnormal movements (rate only patient's report): No Awareness, Dental Status Current problems with teeth and/or dentures?: No Does patient usually wear dentures?: No  CIWA:    COWS:     Musculoskeletal: Strength & Muscle Tone: within normal limits Gait & Station: normal Patient leans: N/A  Psychiatric Specialty Exam: Physical Exam  Nursing note and vitals reviewed. Constitutional: He is oriented to person, place, and time. He appears well-developed and well-nourished.  Cardiovascular: Normal rate.  Respiratory: Effort normal.   Musculoskeletal: Normal range of motion.  Neurological: He is alert and oriented to person, place, and time.  Skin: Skin is warm.    Review of Systems  Constitutional: Negative.   HENT: Negative.   Eyes: Negative.   Respiratory: Negative.   Cardiovascular: Negative.   Gastrointestinal: Negative.   Genitourinary: Negative.   Musculoskeletal: Negative.   Skin: Negative.   Neurological: Negative.   Endo/Heme/Allergies: Negative.   Psychiatric/Behavioral: Negative.     Blood pressure 105/68, pulse 70, temperature 97.7 F (36.5 C), temperature source Oral, resp. rate 18, height 6' (1.829 m), weight 75.6 kg.Body mass index is 22.59 kg/m.  General Appearance: Casual  Eye Contact:  Good  Speech:  Clear and Coherent and Normal Rate  Volume:  Normal  Mood:  Euthymic  Affect:  Congruent  Thought Process:  Coherent and Descriptions of Associations: Intact  Orientation:  Full (Time, Place, and Person)  Thought Content:  WDL  Suicidal Thoughts:  No  Homicidal Thoughts:  No  Memory:  Immediate;   Good Recent;   Good Remote;   Good  Judgement:  Fair  Insight:  Fair  Psychomotor Activity:  Normal  Concentration:  Concentration: Good and Attention Span: Good  Recall:  Good  Fund of Knowledge:  Good  Language:  Good  Akathisia:  No  Handed:  Right  AIMS (if indicated):     Assets:  Communication Skills Desire for Improvement Housing Resilience Social Support Transportation  ADL's:  Intact  Cognition:  WNL  Sleep:  Number of Hours: 5.75   Problems addressed MDD severe recurrent Methamphetamine use disorder severe Substance induced mood disorder Suicide attempt  Treatment Plan Summary: Daily contact with patient to assess and evaluate symptoms and progress in treatment, Medication management and Plan is to: Continue colchicine 0.6 mg p.o. twice daily Continue Lamictal 25 mg p.o. daily for mood stability Continue Protonix 40 mg p.o. daily for GERD Encourage group therapy  participation CSW to assist with disposition Continue every 15 minute safety checks Plan discharge for tomorrow  Lewis Shock, FNP 10/23/2018, 11:17 AM   Attest to NP progress note

## 2018-10-23 NOTE — [facility] Group Notes (Signed)
[facility] Group Notes: (Clinical Social Work)   10/23/2018      Type of Therapy:  Group Therapy   Participation Level:  Did Not Attend - was invited both individually by [facility] and by overhead announcement, chose not to attend.   Selmer Dominion, LCSW 10/23/2018, 12:43 PM

## 2018-10-23 NOTE — Progress Notes (Signed)
Adult Psychoeducational Group Note  Date:  10/23/2018 Time:  9:22 PM  Group Topic/Focus:  Wrap-Up Group:   The focus of this group is to help patients review their daily goal of treatment and discuss progress on daily workbooks.  Participation Level:  Active  Participation Quality:  Appropriate  Affect:  Appropriate  Cognitive:  Appropriate  Insight: Appropriate  Engagement in Group:  Engaged  Modes of Intervention:  Discussion  Additional Comments:  Patient attended wrap up group and participated.   Camaya Gannett W Karen Kinnard 02/18/6393, 9:22 PM

## 2018-10-24 MED ORDER — PANTOPRAZOLE SODIUM 40 MG PO TBEC: 40.0000 mg | DELAYED_RELEASE_TABLET | Freq: Every day | ORAL | 0 refills | Status: DC

## 2018-10-24 MED ORDER — COLCHICINE 0.6 MG PO TABS: 0.6000 mg | ORAL_TABLET | Freq: Two times a day (BID) | ORAL | 0 refills | Status: DC

## 2018-10-24 MED ORDER — LAMOTRIGINE 25 MG PO TABS: 25.0000 mg | ORAL_TABLET | Freq: Every day | ORAL | 0 refills | Status: AC

## 2018-10-24 NOTE — Progress Notes (Signed)
Discharge Note:  Patient discharged home with family member.  Patient denied SI and HI.  Denied A/V hallucinations.  Suicide prevention information given and discussed with patient who stated he understood and had no questions.  Patient stated he received all his belongings, clothing, toiletries, etc.  Patient stated he appreciated all assistance received from [facility] staff.  All required discharge information given to patient at discharge.

## 2018-10-24 NOTE — Progress Notes (Signed)
D:  Patient denied SI and HI, contracts for safety.  Denied A/V hallucinations.   Patient stated he is ready for discharge. A:  Medications administered per MD orders.  Emotional support and encouragement given patient. R:  Safety maintained with 15 minute checks.  Patient's self inventory sheet, patient sleeps good, no sleep medication.  Good appetite, high energy level, good concentration.  Denied depression, hopeless and anxiety.  Denied withdrawals.  Denied SI.  Denied physical problems.  Denied physical pain.  Goal is discharge.  Plans to go home.  Does have discharge plans.
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
## 2018-10-24 NOTE — Progress Notes (Signed)
Recreation Therapy Notes  Date:  6.1.20 Time: 0930 Location: 300 Hall Dayroom  Group Topic: Stress Management  Goal Area(s) Addresses:  Patient will identify positive stress management techniques. Patient will identify benefits of using stress management post d/c.  Intervention:  Stress Management  Activity :  Meditation.  LRT introduced the stress management technique of meditation.  LRT played a meditation that focused on make the most of your day.   Education:  Stress Management, Discharge Planning.   Education Outcome: Acknowledges Education  Clinical Observations/Feedback:  Pt did not attend group.    Victorino Sparrow, LRT/CTRS         Victorino Sparrow A 10/24/2018 11:14 AM

## 2018-10-24 NOTE — Progress Notes (Signed)
  [facility] Adult Case Management Discharge Plan :  Will you be returning to the same living situation after discharge:  Yes,  patient reports that he is returning home with his parents At discharge, do you have transportation home?: Yes,  patient reports that his mother is picking him up at discharge Do you have the ability to pay for your medications: No.  Release of information consent forms completed and in the chart;  Patient's signature needed at discharge.  Patient to Follow up at: Follow-up Information    [facility] Follow up on 10/28/2018.   Why:  Hospital follow up appointment is Friday, 6/5 at 12:30p.  Please bring your photo ID, current medications and discharge paperwork from this hospitalization.  Contact information: [place] [address] [address] Follow up on 11/22/2018.   Why:  Cardiology appointment with Lurena Joiner is Tuesday, 6/30 at 3:00p.  Appointment will be over the phone and the provider will contact you.  Contact information: [address] [place] [address] Ph: [phone] Fx: [phone]          Next level of care provider has access to [place] and Suicide Prevention discussed: Yes,  with the patient   Have you used any form of tobacco in the last 30 days? (Cigarettes, Smokeless Tobacco, Cigars, and/or Pipes): Yes  Has patient been referred to the Quitline?: Patient refused referral  Patient has been referred for addiction treatment: N/A  Marylee Floras, [place] 10/24/2018, 2:40 PM

## 2018-10-24 NOTE — [facility] Suicide Risk Assessment (Signed)
[facility] INPATIENT:  Family/Significant Other Suicide Prevention Education  Suicide Prevention Education:   SPE completed with patient, as patient refused to consent to family contact. SPI pamphlet provided to pt and pt was encouraged to share information with support network, ask questions, and talk about any concerns relating to SPE. Patient denies access to guns/firearms and verbalized understanding of information provided. Mobile Crisis information also provided to patient.  Ethanjames Fontenot, MSW, LCSWA Clinical Social Worker [facility]  Phone: [phone]  

## 2018-10-24 NOTE — Progress Notes (Signed)
Patient ID: Kevin Schaefer., male   DOB: 26-Nov-1980, 38 y.o.   MRN: [id number]  D: Pt calm, cooperative, visible in the day room interacting with his peers earlier in shift, and denies SI/HI/AVH.    A: Pt given his scheduled dose of Ibuprofen for abdominal pain, states pain is 3/10.  Patient currently sleeping at this time, Q15 minute safety checks being maintained.  R: Will continue to maintain on Q15 minute checks.
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
## 2018-10-25 ENCOUNTER — Other Ambulatory Visit: Payer: Self-pay | Admitting: Cardiology

## 2018-10-25 MED ORDER — COLCHICINE 0.6 MG PO TABS: ORAL_TABLET | ORAL | 0 refills | Status: DC

## 2018-10-25 MED FILL — !COLCRYS 0.6 MG TABLET: 0.6 MG | 30 days supply | Qty: 60 | Fill #0

## 2018-10-25 NOTE — Telephone Encounter (Signed)
New Message:  Pt says he can not afford  the Colchicine. He would like something else to take in the place of it. Please call the new medicine to [facility]- [phone].

## 2018-10-25 NOTE — Addendum Note (Signed)
Addended by: Kathyrn Lass on: 10/25/2018 11:06 AM   Modules accepted: Orders

## 2018-10-25 NOTE — Telephone Encounter (Signed)
Patient was recently in the hospital- he states he did see Dr.Jordan in the hospital, but does not have a follow up until June 30th with Kerin Ransom, [place]. I advised if patient had a PCP doctor- and he advised that he does not have one yet- but I advised I would route to PharmD and Dr.Jordan since he did see him in hospital to see if he could with this.  Thank you!

## 2018-10-25 NOTE — Telephone Encounter (Signed)
He is Dr Evette Georges patient but I did see him recently in the hospital. He really needs to stay on colchicine for a minimum of 3 months since he has acute pericarditis and pericardial effusion. I don't know if pharmacy has any recommendations. Perhaps he would qualify with our patient assistance fund.  Lacey Wallman [place] MD, [facility]

## 2018-10-25 NOTE — Telephone Encounter (Signed)
Returned call to patient advised to call [facility] at [phone] and speak to pharmacy about qualifying for free colchicine.Advised they have a program [facility] if you qualify you can receive free colchicine.Advised Prescription sent to them.Advised he needs to take for 3 months.Advised to call back if needed.

## 2018-10-25 NOTE — Addendum Note (Signed)
Addended by: Kathyrn Lass on: 10/25/2018 11:04 AM   Modules accepted: Orders

## 2018-11-18 ENCOUNTER — Telehealth: Payer: Self-pay | Admitting: Cardiology

## 2018-11-18 NOTE — Telephone Encounter (Signed)
call home phone/ my chart via emailed/ consent/ pre reg completed

## 2018-11-22 ENCOUNTER — Telehealth: Payer: Self-pay

## 2018-11-22 ENCOUNTER — Telehealth (INDEPENDENT_AMBULATORY_CARE_PROVIDER_SITE_OTHER): Payer: Self-pay | Admitting: Cardiology

## 2018-11-22 ENCOUNTER — Encounter: Payer: Self-pay | Admitting: Cardiology

## 2018-11-22 VITALS — Ht 72.0 in | Wt 180.0 lb

## 2018-11-22 DIAGNOSIS — I3139 Other pericardial effusion (noninflammatory): Secondary | ICD-10-CM

## 2018-11-22 DIAGNOSIS — F1520 Other stimulant dependence, uncomplicated: Secondary | ICD-10-CM

## 2018-11-22 DIAGNOSIS — I313 Pericardial effusion (noninflammatory): Secondary | ICD-10-CM

## 2018-11-22 DIAGNOSIS — I300 Acute nonspecific idiopathic pericarditis: Secondary | ICD-10-CM

## 2018-11-22 DIAGNOSIS — IMO0002 Reserved for concepts with insufficient information to code with codable children: Secondary | ICD-10-CM

## 2018-11-22 DIAGNOSIS — Z7289 Other problems related to lifestyle: Secondary | ICD-10-CM

## 2018-11-22 MED ORDER — COLCHICINE 0.6 MG PO TABS: 0.6000 mg | ORAL_TABLET | Freq: Two times a day (BID) | ORAL | 0 refills | Status: AC

## 2018-11-22 NOTE — Patient Instructions (Signed)
Medication Instructions:  CONTINUE TAKING ADVIL AS DIRECTED UNTIL YOUR CHEST PAIN GOES AWAY If you need a refill on your cardiac medications before your next appointment, please call your pharmacy.   Lab work: NONE  If you have labs (blood work) drawn today and your tests are completely normal, you will receive your results only by: Marland Kitchen MyChart Message (if you have MyChart) OR . A paper copy in the mail If you have any lab test that is abnormal or we need to change your treatment, we will call you to review the results.  Testing/Procedures: Your physician has requested that you have an echocardiogram. Echocardiography is a painless test that uses sound waves to create images of your heart. It provides your doctor with information about the size and shape of your heart and how well your heart's chambers and valves are working. This procedure takes approximately one hour. There are no restrictions for this procedure. TEST IS COMPLETED AT [facility] [address] SCHEDULE TEST FOR THE LAST WEEK IN JULY  Follow-Up: At [facility], you and your health needs are our priority.  As part of our continuing mission to provide you with exceptional heart care, we have created designated Provider Care Teams.  These Care Teams include your primary Cardiologist (physician) and Advanced Practice Providers (APPs -  Physician Assistants and Nurse Practitioners) who all work together to provide you with the care you need, when you need it. You will need a follow up appointment in 3 months.  Please call our office 2 months in advance to schedule this appointment.  You may see Shelva Majestic, MD or one of the following Advanced Practice Providers on your designated Care Team: [place], [place] . Fabian Sharp, PA-C  Any Other Special Instructions Will Be Listed Below (If Applicable).

## 2018-11-22 NOTE — Telephone Encounter (Signed)
Contacted patient to discuss AVS Instructions. Gave patient Luke's  recommendations from today's virtual office visit. Informed patient that someone from the scheduling dept will be in contact with them to schedule their follow up appt and Echo. Patient voiced understanding and AVS mailed.
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
## 2018-11-28 MED FILL — !COLCRYS 0.6 MG TABLET: 0.6 MG | 15 days supply | Qty: 30 | Fill #1

## 2018-11-29 ENCOUNTER — Telehealth: Payer: Self-pay | Admitting: Cardiovascular Disease

## 2018-11-29 NOTE — Telephone Encounter (Signed)
New message:    Patient calling concering his medications and would like for some one call him back.

## 2018-11-29 NOTE — Telephone Encounter (Signed)
Noted, will be on the lookout for paperwork-  Thanks!

## 2018-11-29 NOTE — Telephone Encounter (Signed)
Spoke to patient he stated he spoke to [facility] about colchicine too expensive.Stated they will be giving him samples until he can completes patient assistance application.He will complete his portion and will bringing application for Dr.Kelly to complete and fax to manufacturer.Message sent to Dr.Kelly's nurse.

## 2018-12-09 ENCOUNTER — Ambulatory Visit: Payer: Medicaid Other | Admitting: Pharmacy Technician

## 2018-12-09 ENCOUNTER — Other Ambulatory Visit: Payer: Self-pay

## 2018-12-09 DIAGNOSIS — Z79899 Other long term (current) drug therapy: Secondary | ICD-10-CM

## 2018-12-09 NOTE — Progress Notes (Signed)
Completed financial assistance application for [place] due to recent hospital visit. Patient agreed to be responsible for gathering financial information and forwarding to appropriate department in [facility].    Patient approved to receive medication assistance at [facility] as long as eligibility criteria continues to be met  [facility]

## 2018-12-20 ENCOUNTER — Emergency Department
Admission: EM | Admit: 2018-12-20 | Discharge: 2018-12-21 | Disposition: A | Discharge status: Home / Self Care | Payer: Medicaid Other | Attending: Emergency Medicine | Admitting: Emergency Medicine

## 2018-12-20 DIAGNOSIS — I313 Pericardial effusion (noninflammatory): Secondary | ICD-10-CM | POA: Diagnosis present

## 2018-12-20 DIAGNOSIS — F1510 Other stimulant abuse, uncomplicated: Secondary | ICD-10-CM

## 2018-12-20 DIAGNOSIS — R4689 Other symptoms and signs involving appearance and behavior: Secondary | ICD-10-CM

## 2018-12-20 DIAGNOSIS — R0789 Other chest pain: Secondary | ICD-10-CM | POA: Diagnosis present

## 2018-12-20 DIAGNOSIS — I10 Essential (primary) hypertension: Secondary | ICD-10-CM | POA: Insufficient documentation

## 2018-12-20 DIAGNOSIS — IMO0002 Reserved for concepts with insufficient information to code with codable children: Secondary | ICD-10-CM

## 2018-12-20 DIAGNOSIS — F329 Major depressive disorder, single episode, unspecified: Secondary | ICD-10-CM | POA: Diagnosis present

## 2018-12-20 DIAGNOSIS — I300 Acute nonspecific idiopathic pericarditis: Secondary | ICD-10-CM | POA: Diagnosis present

## 2018-12-20 DIAGNOSIS — F1520 Other stimulant dependence, uncomplicated: Secondary | ICD-10-CM | POA: Diagnosis present

## 2018-12-20 DIAGNOSIS — I3139 Other pericardial effusion (noninflammatory): Secondary | ICD-10-CM | POA: Diagnosis present

## 2018-12-20 DIAGNOSIS — Z7289 Other problems related to lifestyle: Secondary | ICD-10-CM

## 2018-12-20 DIAGNOSIS — F17210 Nicotine dependence, cigarettes, uncomplicated: Secondary | ICD-10-CM | POA: Insufficient documentation

## 2018-12-20 DIAGNOSIS — R456 Violent behavior: Secondary | ICD-10-CM | POA: Insufficient documentation

## 2018-12-20 DIAGNOSIS — F332 Major depressive disorder, recurrent severe without psychotic features: Secondary | ICD-10-CM | POA: Diagnosis present

## 2018-12-20 DIAGNOSIS — Z79899 Other long term (current) drug therapy: Secondary | ICD-10-CM | POA: Insufficient documentation

## 2018-12-20 LAB — CBC
HCT: 36.7 % — ABNORMAL LOW (ref 39.0–52.0)
Hemoglobin: 12.9 g/dL — ABNORMAL LOW (ref 13.0–17.0)
MCH: 29.9 pg (ref 26.0–34.0)
MCHC: 35.1 g/dL (ref 30.0–36.0)
MCV: 85 fL (ref 80.0–100.0)
Platelets: 222 10*3/uL (ref 150–400)
RBC: 4.32 MIL/uL (ref 4.22–5.81)
RDW: 13 % (ref 11.5–15.5)
WBC: 8.9 10*3/uL (ref 4.0–10.5)
nRBC: 0 % (ref 0.0–0.2)

## 2018-12-20 LAB — ETHANOL: Alcohol, Ethyl (B): <10 mg/dL (ref <10)

## 2018-12-20 LAB — COMPREHENSIVE METABOLIC PANEL
ALT: 27 U/L (ref 0–44)
AST: 43 U/L — ABNORMAL HIGH (ref 15–41)
Albumin: 4.9 g/dL (ref 3.5–5.0)
Alkaline Phosphatase: 60 U/L (ref 38–126)
Anion gap: 14 (ref 5–15)
BUN: 17 mg/dL (ref 6–20)
CO2: 26 mmol/L (ref 22–32)
Calcium: 10.1 mg/dL (ref 8.9–10.3)
Chloride: 96 mmol/L — ABNORMAL LOW (ref 98–111)
Creatinine, Ser: 0.8 mg/dL (ref 0.61–1.24)
GFR calc Af Amer: >60 mL/min (ref >60)
GFR calc non Af Amer: >60 mL/min (ref >60)
Glucose, Bld: 132 mg/dL — ABNORMAL HIGH (ref 70–99)
Potassium: 3 mmol/L — ABNORMAL LOW (ref 3.5–5.1)
Sodium: 136 mmol/L (ref 135–145)
Total Bilirubin: 1.2 mg/dL (ref 0.3–1.2)
Total Protein: 7.8 g/dL (ref 6.5–8.1)

## 2018-12-20 LAB — URINE DRUG SCREEN, QUALITATIVE (ARMC ONLY)
Amphetamines, Ur Screen: POSITIVE — AB
Barbiturates, Ur Screen: NOT DETECTED
Benzodiazepine, Ur Scrn: NOT DETECTED
Cannabinoid 50 Ng, Ur ~~LOC~~: POSITIVE — AB
Cocaine Metabolite,Ur ~~LOC~~: NOT DETECTED
MDMA (Ecstasy)Ur Screen: NOT DETECTED
Methadone Scn, Ur: NOT DETECTED
Opiate, Ur Screen: NOT DETECTED
Phencyclidine (PCP) Ur S: NOT DETECTED
Tricyclic, Ur Screen: NOT DETECTED

## 2018-12-20 LAB — SALICYLATE LEVEL: Salicylate Lvl: <7 mg/dL (ref 2.8–30.0)

## 2018-12-20 LAB — ACETAMINOPHEN LEVEL: Acetaminophen (Tylenol), Serum: <10 ug/mL — ABNORMAL LOW (ref 10–30)

## 2018-12-20 MED ORDER — IBUPROFEN 600 MG PO TABS
600.0000 mg | ORAL_TABLET | Freq: Four times a day (QID) | ORAL | Status: DC
  Administered 2018-12-20 – 2018-12-21 (×3): 600 mg via ORAL
  Filled 2018-12-20 (×3): qty 1

## 2018-12-20 MED ORDER — COLCHICINE 0.6 MG PO TABS
0.6000 mg | ORAL_TABLET | Freq: Two times a day (BID) | ORAL | Status: DC
  Administered 2018-12-20 (×2): 0.6 mg via ORAL
  Filled 2018-12-20 (×4): qty 1

## 2018-12-20 MED ORDER — LAMOTRIGINE 25 MG PO TABS
25.0000 mg | ORAL_TABLET | Freq: Every day | ORAL | Status: DC
  Administered 2018-12-20 – 2018-12-21 (×2): 25 mg via ORAL
  Filled 2018-12-20 (×3): qty 1
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
## 2018-12-20 NOTE — ED Notes (Signed)
Hourly rounding reveals patient in room. No complaints, stable, in no acute distress. Q15 minute rounds and monitoring via Security Cameras to continue. 

## 2018-12-20 NOTE — ED Notes (Signed)
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
## 2018-12-20 NOTE — ED Notes (Signed)
Pt provided breakfast tray at this time. 

## 2018-12-20 NOTE — ED Notes (Signed)
Hourly rounding reveals patient sleeping in room. No complaints, stable, in no acute distress. Q15 minute rounds and monitoring via Security Cameras to continue. 
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
## 2018-12-20 NOTE — ED Notes (Signed)
Pt reports that his family has been very "on edge" about his mental stability recently. He feels he is mentally stable with the exception of a suicide attempt in May of 2020. Pt reports feeling more anxious than normal but wishes everyone would "calm down" and "stop worrying about me".

## 2018-12-20 NOTE — ED Notes (Signed)
PT  VOL  MOVED  TO  BHU  UNIT

## 2018-12-20 NOTE — ED Notes (Signed)
Pt given meal tray.

## 2018-12-20 NOTE — ED Notes (Signed)
Pt other belongings: 3 cigarettes, set of keys, 3 $1 notes, multiple bottles of medications(will give to nurse to go over once in room)
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
## 2018-12-20 NOTE — ED Notes (Signed)
Report to include Situation, Background, Assessment, and Recommendations received from [facility]. Patient alert and oriented, warm and dry, in no acute distress. Patient denies SI, HI, AVH and pain. Patient made aware of Q15 minute rounds and security cameras for their safety. Patient instructed to come to me with needs or concerns.

## 2018-12-20 NOTE — ED Notes (Signed)
Psych and TTS at bedside. 

## 2018-12-21 NOTE — ED Notes (Signed)
Patient voiced understanding of discharge instructions, all of his belongings given back to him, no signs of distress, his mom is here to transport home, He received all information regarding substance abuse, and to follow up with [facility].

## 2018-12-21 NOTE — ED Notes (Signed)
Patient up to bathroom, Psych NP talked with Patient, Patient is calm and cooperative.
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
## 2018-12-21 NOTE — ED Notes (Signed)
Hourly rounding reveals patient sleeping in room. No complaints, stable, in no acute distress. Q15 minute rounds and monitoring via Security Cameras to continue. 

## 2018-12-21 NOTE — ED Provider Notes (Signed)
-----------------------------------------   12:31 PM on 12/21/2018 -----------------------------------------   Blood pressure 119/65, pulse 63, temperature 98.1 F (36.7 C), temperature source Oral, resp. rate 16, height 6' (1.829 m), weight 74.8 kg, SpO2 99 %.  The patient is calm and cooperative at this time.  There have been no acute events since the last update.  Patient remains medically stable.  Psychiatry has evaluated the patient and found him to be psychiatrically stable, corroborated by patient's family as well.   Carrie Mew, MD 12/21/18 1231

## 2018-12-21 NOTE — ED Notes (Addendum)
Nurse talked to Patient regarding His meth use, and He states that He started when he was 38 years old and the longest He had been clean was 18 years when He was in prison, Nurse gave him information about [facility], and talked to him about inner strength.  Patient denies Si/hi or avh at this time, Nurse will continue to monitor.

## 2018-12-22 ENCOUNTER — Ambulatory Visit (HOSPITAL_COMMUNITY): Payer: Self-pay | Attending: Cardiovascular Disease

## 2018-12-22 ENCOUNTER — Other Ambulatory Visit: Payer: Self-pay

## 2018-12-22 DIAGNOSIS — I313 Pericardial effusion (noninflammatory): Secondary | ICD-10-CM | POA: Insufficient documentation

## 2018-12-22 DIAGNOSIS — I3139 Other pericardial effusion (noninflammatory): Secondary | ICD-10-CM

## 2018-12-30 ENCOUNTER — Telehealth: Payer: Self-pay | Admitting: Cardiovascular Disease

## 2018-12-30 NOTE — Telephone Encounter (Signed)
Dr.Kelly can you please look over these results and advise. Thank you!  Called patient and advised that I would notify Dr.Kelly, patient verbalized understanding.

## 2018-12-30 NOTE — Telephone Encounter (Signed)
New Message   Patient is calling to obtain echocardiogram results. Please call.

## 2019-01-02 ENCOUNTER — Encounter: Payer: Self-pay | Admitting: Cardiovascular Disease

## 2019-01-02 NOTE — Telephone Encounter (Signed)
Called patient back, advised of results per Dr.Kelly.  Follow up scheduled with Lurena Joiner- will route to him to make him aware, and Primary as well. Thanks!

## 2019-01-02 NOTE — Telephone Encounter (Signed)
His most recent echo shows resolution of his previous pericardial effusion.  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  Recommend follow-up ov evaluation with Lurena Joiner.  May need to start additional medication and consider potential follow-up studies if needed

## 2019-01-02 NOTE — Telephone Encounter (Signed)
Error

## 2019-01-02 NOTE — Telephone Encounter (Signed)
  Patient is calling back following up on his echo results

## 2019-01-20 ENCOUNTER — Other Ambulatory Visit: Payer: Self-pay

## 2019-01-20 ENCOUNTER — Encounter: Payer: Self-pay | Admitting: Cardiology

## 2019-01-20 ENCOUNTER — Ambulatory Visit (INDEPENDENT_AMBULATORY_CARE_PROVIDER_SITE_OTHER): Payer: Self-pay | Admitting: Cardiology

## 2019-01-20 VITALS — BP 109/73 | HR 80 | Temp 98.2°F | Ht 72.0 in | Wt 177.6 lb

## 2019-01-20 DIAGNOSIS — F1520 Other stimulant dependence, uncomplicated: Secondary | ICD-10-CM

## 2019-01-20 DIAGNOSIS — I429 Cardiomyopathy, unspecified: Secondary | ICD-10-CM

## 2019-01-20 DIAGNOSIS — I3139 Other pericardial effusion (noninflammatory): Secondary | ICD-10-CM

## 2019-01-20 DIAGNOSIS — I428 Other cardiomyopathies: Secondary | ICD-10-CM | POA: Insufficient documentation

## 2019-01-20 DIAGNOSIS — I5181 Takotsubo syndrome: Secondary | ICD-10-CM

## 2019-01-20 DIAGNOSIS — I313 Pericardial effusion (noninflammatory): Secondary | ICD-10-CM

## 2019-01-20 NOTE — Assessment & Plan Note (Signed)
Recurrent admissions to the ED- last 12/20/2018

## 2019-01-20 NOTE — Patient Instructions (Signed)
Medication Instructions:  Your physician recommends that you continue on your current medications as directed. Please refer to the Current Medication list given to you today. If you need a refill on your cardiac medications before your next appointment, please call your pharmacy.   Lab work: None  If you have labs (blood work) drawn today and your tests are completely normal, you will receive your results only by: Marland Kitchen MyChart Message (if you have MyChart) OR . A paper copy in the mail If you have any lab test that is abnormal or we need to change your treatment, we will call you to review the results.  Testing/Procedures: Your physician has requested that you have an echocardiogram. Echocardiography is a painless test that uses sound waves to create images of your heart. It provides your doctor with information about the size and shape of your heart and how well your heart's chambers and valves are working. This procedure takes approximately one hour. There are no restrictions for this procedure. SCHEDULE FOR 3 MONTHS OUT  TEST WILL BE COMPLETED AT [address]  Follow-Up: At [facility], you and your health needs are our priority.  As part of our continuing mission to provide you with exceptional heart care, we have created designated Provider Care Teams.  These Care Teams include your primary Cardiologist (physician) and Advanced Practice Providers (APPs -  Physician Assistants and Nurse Practitioners) who all work together to provide you with the care you need, when you need it. You will need a follow up appointment in 3 months.  Please call our office 2 months in advance to schedule this appointment.  You may see Shelva Majestic, MD ONLY or one of the following Advanced Practice Providers on your designated Care Team: [place], [place] . Fabian Sharp, PA-C  Any Other Special Instructions Will Be Listed Below (If Applicable).

## 2019-01-20 NOTE — Assessment & Plan Note (Addendum)
Cath May 2020- normal coronaries with WMA. His EF then was 50%. It was felt this was possibly a Takotsubo event.  EF by echo July 2020- 35-40%.

## 2019-01-20 NOTE — Assessment & Plan Note (Signed)
Pericardial effusion May 2020 without tamponade- treated with colchicine and NSAIDs
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
## 2019-03-29 ENCOUNTER — Other Ambulatory Visit (HOSPITAL_COMMUNITY): Payer: Self-pay

## 2019-04-03 ENCOUNTER — Encounter (HOSPITAL_COMMUNITY): Payer: Self-pay | Admitting: Cardiology

## 2019-04-10 ENCOUNTER — Ambulatory Visit: Payer: Self-pay | Admitting: Cardiovascular Disease

## 2019-04-10 ENCOUNTER — Telehealth (HOSPITAL_COMMUNITY): Payer: Self-pay

## 2019-04-10 NOTE — Telephone Encounter (Signed)
Will route to MD to make aware. 

## 2019-04-10 NOTE — Telephone Encounter (Signed)
New message    Just an FYI. We have made several attempts to contact this patient including sending a letter to schedule or reschedule their echocardiogram. We will be removing the patient from the echo WQ.  11.11.20 @ 3:53pm lm on home vm - Ishaaq Penna  11.9.20 mail reminder letter Digby Groeneveld  11.3.20 Cancel Rsn: Patient (patient out of town)

## 2019-07-21 ENCOUNTER — Telehealth (INDEPENDENT_AMBULATORY_CARE_PROVIDER_SITE_OTHER): Payer: Medicaid Other | Admitting: Physician Assistant

## 2019-07-21 NOTE — Progress Notes (Signed)
Unable to reach the patient for virtual visit. App cancelled.

## 2020-06-03 ENCOUNTER — Telehealth: Payer: Self-pay | Admitting: Pharmacy Technician

## 2020-06-03 NOTE — Telephone Encounter (Signed)
Patient failed to provide 2021 proof of income.  No additional medication assistance will be provided by [facility] without the required proof of income documentation.  Patient notified by letter.  Kevin Schaefer J. Sue Fernicola Care Manager [facility]   [address] [place], [place]  [address]     This is to inform you that you are no longer eligible to receive medication assistance at [facility].  The reason(s) are:    _____Your total gross monthly household income exceeds 250% of the Federal Poverty Level.   _____Tangible assets (savings, checking, stocks/bonds, pension, retirement, etc.) exceeds our limit  _____You are eligible to receive benefits from Medicaid, [facility] or HIV Medication              Assistance Program _____You are eligible to receive benefits from a Medicare Part "D" plan _____You have prescription insurance  _____You are not an [place] resident __X__Failure to provide all requested proof of income information for 2021.    Medication assistance will resume once all requested financial information has been returned to our clinic.  If you have questions, please contact our clinic at [phone].    Thank you,  [facility] 

## 2021-03-03 IMAGING — DX PORTABLE CHEST - 1 VIEW
1 series · 1 of 1 positions shown · non-contrast
Comparison: 10/12/2018

CLINICAL DATA: Chest pain

EXAM:
PORTABLE CHEST 1 VIEW

[chest]
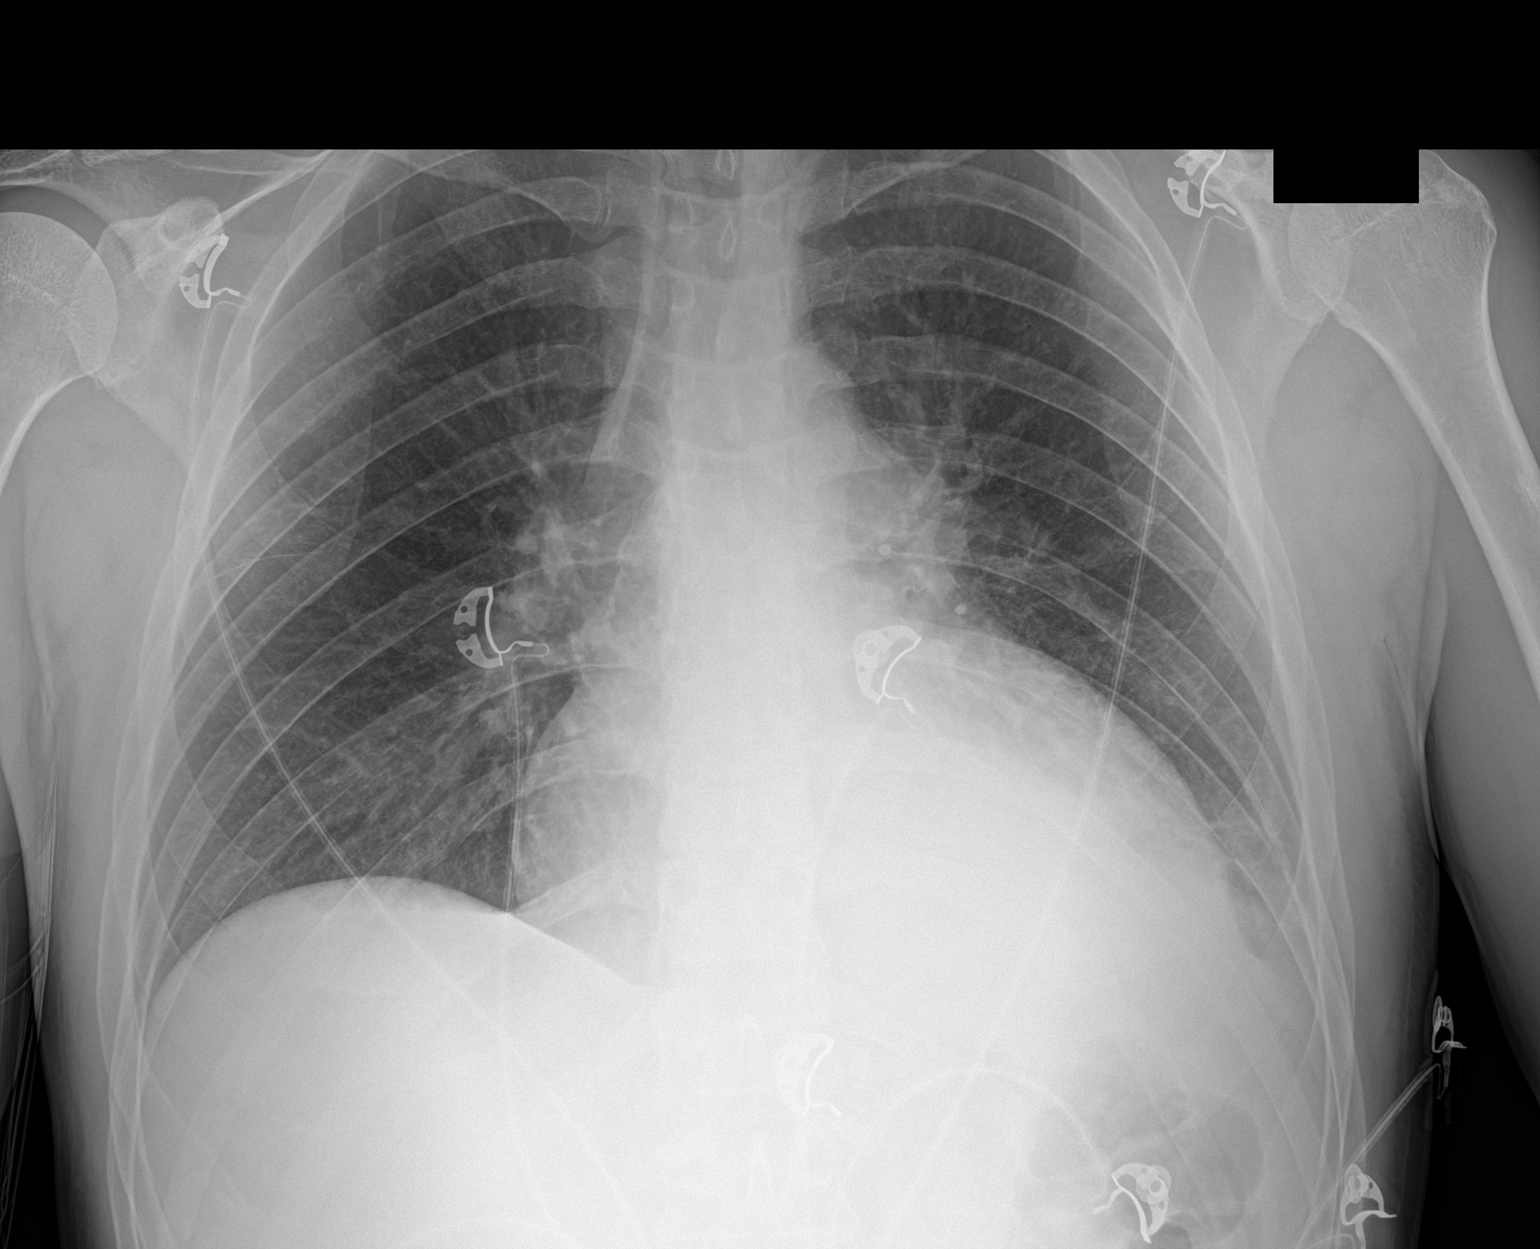

[1 of 1 positions shown; findings below may reference images not displayed]

FINDINGS: The cardiac silhouette is enlarged. There has been interval
development of a retrocardiac opacity. There is mild volume
overload. No pneumothorax. The right lung field is essentially
clear. No acute osseous abnormality.
IMPRESSION: 1. Worsening retrocardiac opacity may represent a combination of a
left-sided pleural effusion and atelectasis or infiltrate.
2. Cardiomegaly.
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
